# Patient Record
Sex: Female | Born: 1935 | Race: White | Hispanic: No | Smoking: Never smoker
Health system: Southern US, Community
[De-identification: ages and names within clinical notes are randomized; demographics above are authoritative.]

## PROBLEM LIST (undated history)

## (undated) DIAGNOSIS — I10 Essential (primary) hypertension: Secondary | ICD-10-CM

## (undated) DIAGNOSIS — I499 Cardiac arrhythmia, unspecified: Secondary | ICD-10-CM

## (undated) DIAGNOSIS — B9620 Unspecified Escherichia coli [E. coli] as the cause of diseases classified elsewhere: Secondary | ICD-10-CM

## (undated) DIAGNOSIS — I4891 Unspecified atrial fibrillation: Secondary | ICD-10-CM

## (undated) DIAGNOSIS — M7989 Other specified soft tissue disorders: Secondary | ICD-10-CM

## (undated) DIAGNOSIS — N390 Urinary tract infection, site not specified: Secondary | ICD-10-CM

## (undated) DIAGNOSIS — Q396 Congenital diverticulum of esophagus: Secondary | ICD-10-CM

## (undated) DIAGNOSIS — E079 Disorder of thyroid, unspecified: Secondary | ICD-10-CM

## (undated) HISTORY — PX: BREAST SURGERY: SHX581

## (undated) HISTORY — DX: Other specified soft tissue disorders: M79.89

## (undated) HISTORY — DX: Disorder of thyroid, unspecified: E07.9

## (undated) HISTORY — DX: Essential (primary) hypertension: I10

---

## 2000-09-15 ENCOUNTER — Other Ambulatory Visit: Admission: RE | Admit: 2000-09-15 | Discharge: 2000-09-15 | Payer: Self-pay | Admitting: *Deleted

## 2000-10-05 ENCOUNTER — Encounter: Admission: RE | Admit: 2000-10-05 | Discharge: 2000-10-05 | Payer: Self-pay | Admitting: *Deleted

## 2000-10-07 ENCOUNTER — Ambulatory Visit (HOSPITAL_BASED_OUTPATIENT_CLINIC_OR_DEPARTMENT_OTHER): Admission: RE | Admit: 2000-10-07 | Discharge: 2000-10-07 | Payer: Self-pay | Admitting: *Deleted

## 2000-10-15 ENCOUNTER — Ambulatory Visit: Admission: RE | Admit: 2000-10-15 | Discharge: 2001-01-13 | Payer: Self-pay | Admitting: Radiation Oncology

## 2010-08-27 ENCOUNTER — Encounter: Payer: Self-pay | Admitting: Podiatrist

## 2010-08-27 DIAGNOSIS — M109 Gout, unspecified: Secondary | ICD-10-CM | POA: Insufficient documentation

## 2010-08-27 DIAGNOSIS — I10 Essential (primary) hypertension: Secondary | ICD-10-CM | POA: Insufficient documentation

## 2010-08-27 DIAGNOSIS — R609 Edema, unspecified: Secondary | ICD-10-CM | POA: Insufficient documentation

## 2010-08-27 DIAGNOSIS — M779 Enthesopathy, unspecified: Secondary | ICD-10-CM | POA: Insufficient documentation

## 2010-08-27 DIAGNOSIS — E079 Disorder of thyroid, unspecified: Secondary | ICD-10-CM | POA: Insufficient documentation

## 2010-08-27 DIAGNOSIS — M7989 Other specified soft tissue disorders: Secondary | ICD-10-CM

## 2010-11-26 ENCOUNTER — Inpatient Hospital Stay (HOSPITAL_COMMUNITY)
Admission: EM | Admit: 2010-11-26 | Discharge: 2010-12-05 | DRG: 391 | Disposition: A | Discharge status: Home / Self Care | Payer: Medicare Other | Attending: Internal Medicine | Admitting: Internal Medicine

## 2010-11-26 DIAGNOSIS — Z9221 Personal history of antineoplastic chemotherapy: Secondary | ICD-10-CM

## 2010-11-26 DIAGNOSIS — A419 Sepsis, unspecified organism: Secondary | ICD-10-CM | POA: Diagnosis not present

## 2010-11-26 DIAGNOSIS — K8020 Calculus of gallbladder without cholecystitis without obstruction: Secondary | ICD-10-CM | POA: Diagnosis present

## 2010-11-26 DIAGNOSIS — I10 Essential (primary) hypertension: Secondary | ICD-10-CM | POA: Diagnosis present

## 2010-11-26 DIAGNOSIS — Z7982 Long term (current) use of aspirin: Secondary | ICD-10-CM

## 2010-11-26 DIAGNOSIS — Z853 Personal history of malignant neoplasm of breast: Secondary | ICD-10-CM

## 2010-11-26 DIAGNOSIS — N390 Urinary tract infection, site not specified: Secondary | ICD-10-CM | POA: Diagnosis present

## 2010-11-26 DIAGNOSIS — E039 Hypothyroidism, unspecified: Secondary | ICD-10-CM | POA: Diagnosis present

## 2010-11-26 DIAGNOSIS — R7881 Bacteremia: Secondary | ICD-10-CM | POA: Diagnosis present

## 2010-11-26 DIAGNOSIS — R079 Chest pain, unspecified: Secondary | ICD-10-CM | POA: Diagnosis present

## 2010-11-26 DIAGNOSIS — Z79899 Other long term (current) drug therapy: Secondary | ICD-10-CM

## 2010-11-26 DIAGNOSIS — K449 Diaphragmatic hernia without obstruction or gangrene: Principal | ICD-10-CM | POA: Diagnosis present

## 2010-11-26 DIAGNOSIS — M109 Gout, unspecified: Secondary | ICD-10-CM | POA: Diagnosis present

## 2010-11-26 DIAGNOSIS — A4151 Sepsis due to Escherichia coli [E. coli]: Secondary | ICD-10-CM | POA: Diagnosis not present

## 2010-11-26 DIAGNOSIS — I4891 Unspecified atrial fibrillation: Secondary | ICD-10-CM | POA: Diagnosis not present

## 2010-11-26 DIAGNOSIS — E785 Hyperlipidemia, unspecified: Secondary | ICD-10-CM | POA: Diagnosis present

## 2010-11-26 DIAGNOSIS — E669 Obesity, unspecified: Secondary | ICD-10-CM | POA: Diagnosis present

## 2010-11-26 DIAGNOSIS — J309 Allergic rhinitis, unspecified: Secondary | ICD-10-CM | POA: Diagnosis present

## 2010-11-27 ENCOUNTER — Emergency Department (HOSPITAL_COMMUNITY): Payer: Medicare Other

## 2010-11-27 LAB — POCT I-STAT TROPONIN I
Troponin i, poc: 0.02 ng/mL (ref 0.00–0.08)
Troponin i, poc: 0.03 ng/mL (ref 0.00–0.08)

## 2010-11-27 LAB — URINALYSIS, ROUTINE W REFLEX MICROSCOPIC
Bilirubin Urine: NEGATIVE
Glucose, UA: NEGATIVE mg/dL
Ketones, ur: 15 mg/dL — AB
Leukocytes, UA: NEGATIVE
Nitrite: POSITIVE — AB
Protein, ur: NEGATIVE mg/dL
Specific Gravity, Urine: 1.045 — ABNORMAL HIGH (ref 1.005–1.030)
Urobilinogen, UA: 1 mg/dL (ref 0.0–1.0)
pH: 5.5 (ref 5.0–8.0)

## 2010-11-27 LAB — APTT: aPTT: 31 seconds (ref 24–37)

## 2010-11-27 LAB — DIFFERENTIAL
Basophils Absolute: 0 10*3/uL (ref 0.0–0.1)
Eosinophils Absolute: 0 10*3/uL (ref 0.0–0.7)
Lymphocytes Relative: 12 % (ref 12–46)
Monocytes Relative: 4 % (ref 3–12)
Neutrophils Relative %: 84 % — ABNORMAL HIGH (ref 43–77)

## 2010-11-27 LAB — COMPREHENSIVE METABOLIC PANEL
ALT: 9 U/L (ref 0–35)
AST: 18 U/L (ref 0–37)
Albumin: 3.7 g/dL (ref 3.5–5.2)
Calcium: 9.8 mg/dL (ref 8.4–10.5)
Chloride: 104 mEq/L (ref 96–112)
Creatinine, Ser: 0.9 mg/dL (ref 0.50–1.10)
Sodium: 139 mEq/L (ref 135–145)
Total Bilirubin: 0.6 mg/dL (ref 0.3–1.2)

## 2010-11-27 LAB — CK TOTAL AND CKMB (NOT AT ARMC)
CK, MB: 2.6 ng/mL (ref 0.3–4.0)
CK, MB: 2 ng/mL (ref 0.3–4.0)
Relative Index: INVALID (ref 0.0–2.5)
Relative Index: INVALID (ref 0.0–2.5)
Total CK: 70 U/L (ref 7–177)
Total CK: 69 U/L (ref 7–177)

## 2010-11-27 LAB — CBC
Platelets: 214 10*3/uL (ref 150–400)
RBC: 4.47 MIL/uL (ref 3.87–5.11)
WBC: 8.5 10*3/uL (ref 4.0–10.5)

## 2010-11-27 LAB — URINE MICROSCOPIC-ADD ON

## 2010-11-27 LAB — CARDIAC PANEL(CRET KIN+CKTOT+MB+TROPI)
CK, MB: 2.5 ng/mL (ref 0.3–4.0)
Relative Index: 2 (ref 0.0–2.5)
Troponin I: <0.3 ng/mL (ref <0.30)
Troponin I: <0.3 ng/mL (ref <0.30)

## 2010-11-27 LAB — PROTIME-INR
INR: 0.95 (ref 0.00–1.49)
Prothrombin Time: 12.9 seconds (ref 11.6–15.2)

## 2010-11-27 LAB — HEMOGLOBIN A1C: Mean Plasma Glucose: 111 mg/dL (ref <117)

## 2010-11-27 LAB — TSH: TSH: 0.381 u[IU]/mL (ref 0.350–4.500)

## 2010-11-27 MED ORDER — IOHEXOL 300 MG/ML  SOLN
80.0000 mL | Freq: Once | INTRAMUSCULAR | Status: AC | PRN
  Administered 2010-11-27: 80 mL via INTRAVENOUS

## 2010-11-28 HISTORY — PX: TRANSTHORACIC ECHOCARDIOGRAM: SHX275

## 2010-11-28 LAB — BASIC METABOLIC PANEL
BUN: 20 mg/dL (ref 6–23)
CO2: 24 mEq/L (ref 19–32)
Calcium: 9.2 mg/dL (ref 8.4–10.5)
Chloride: 100 mEq/L (ref 96–112)
Creatinine, Ser: 1.49 mg/dL — ABNORMAL HIGH (ref 0.50–1.10)

## 2010-11-28 LAB — LIPID PANEL
Cholesterol: 138 mg/dL (ref 0–200)
LDL Cholesterol: 64 mg/dL (ref 0–99)
Total CHOL/HDL Ratio: 2.4 RATIO
VLDL: 16 mg/dL (ref 0–40)

## 2010-11-28 LAB — MAGNESIUM: Magnesium: 2.1 mg/dL (ref 1.5–2.5)

## 2010-11-29 LAB — CBC
HCT: 36.3 % (ref 36.0–46.0)
Hemoglobin: 12.4 g/dL (ref 12.0–15.0)
MCV: 94.3 fL (ref 78.0–100.0)
RBC: 3.85 MIL/uL — ABNORMAL LOW (ref 3.87–5.11)
RDW: 13.4 % (ref 11.5–15.5)
WBC: 11.3 10*3/uL — ABNORMAL HIGH (ref 4.0–10.5)

## 2010-11-29 LAB — BASIC METABOLIC PANEL
BUN: 19 mg/dL (ref 6–23)
CO2: 20 mEq/L (ref 19–32)
GFR calc non Af Amer: 38 mL/min — ABNORMAL LOW (ref >90)
Glucose, Bld: 105 mg/dL — ABNORMAL HIGH (ref 70–99)
Potassium: 3.7 mEq/L (ref 3.5–5.1)

## 2010-11-30 LAB — COMPREHENSIVE METABOLIC PANEL
BUN: 21 mg/dL (ref 6–23)
CO2: 21 mEq/L (ref 19–32)
Calcium: 9 mg/dL (ref 8.4–10.5)
Creatinine, Ser: 1.27 mg/dL — ABNORMAL HIGH (ref 0.50–1.10)
GFR calc Af Amer: 43 mL/min — ABNORMAL LOW (ref >90)
GFR calc non Af Amer: 37 mL/min — ABNORMAL LOW (ref >90)
Glucose, Bld: 131 mg/dL — ABNORMAL HIGH (ref 70–99)
Total Protein: 6.4 g/dL (ref 6.0–8.3)

## 2010-11-30 LAB — CBC
HCT: 35.8 % — ABNORMAL LOW (ref 36.0–46.0)
Hemoglobin: 12.6 g/dL (ref 12.0–15.0)
MCH: 33 pg (ref 26.0–34.0)
MCHC: 35.2 g/dL (ref 30.0–36.0)
MCV: 93.7 fL (ref 78.0–100.0)

## 2010-11-30 LAB — URINE CULTURE

## 2010-11-30 LAB — CULTURE, BLOOD (ROUTINE X 2)

## 2010-11-30 MED ORDER — SODIUM CHLORIDE 0.9 % IJ SOLN
3.0000 mL | Freq: Two times a day (BID) | INTRAMUSCULAR | Status: DC
  Administered 2010-12-01 – 2010-12-04 (×8): 3 mL via INTRAVENOUS

## 2010-11-30 MED ORDER — ALPRAZOLAM 0.25 MG PO TABS: 0.2500 mg | ORAL_TABLET | Freq: Two times a day (BID) | ORAL | Status: DC | PRN

## 2010-11-30 MED ORDER — PIPERACILLIN-TAZOBACTAM 3.375 G IVPB
3.3750 g | Freq: Three times a day (TID) | INTRAVENOUS | Status: AC
  Administered 2010-12-01 – 2010-12-03 (×6): 3.375 g via INTRAVENOUS
  Filled 2010-11-30 (×7): qty 50

## 2010-11-30 MED ORDER — PRAMOXINE HCL 1 % RE OINT: TOPICAL_OINTMENT | Freq: Three times a day (TID) | RECTAL | Status: DC | PRN

## 2010-11-30 MED ORDER — ALUM & MAG HYDROXIDE-SIMETH 200-200-20 MG/5ML PO SUSP: 15.0000 mL | ORAL | Status: DC | PRN

## 2010-11-30 MED ORDER — DILTIAZEM HCL ER COATED BEADS 240 MG PO CP24
240.0000 mg | ORAL_CAPSULE | Freq: Every day | ORAL | Status: DC
  Administered 2010-12-01 – 2010-12-05 (×5): 240 mg via ORAL
  Filled 2010-11-30 (×5): qty 1

## 2010-11-30 MED ORDER — ONDANSETRON HCL 4 MG/2ML IJ SOLN: 4.0000 mg | Freq: Four times a day (QID) | INTRAMUSCULAR | Status: DC | PRN

## 2010-11-30 MED ORDER — LEVOTHYROXINE SODIUM 75 MCG PO TABS
75.0000 ug | ORAL_TABLET | Freq: Every day | ORAL | Status: DC
  Administered 2010-12-01 – 2010-12-05 (×5): 75 ug via ORAL
  Filled 2010-11-30 (×6): qty 1

## 2010-11-30 MED ORDER — NITROGLYCERIN 0.4 MG SL SUBL: 0.4000 mg | SUBLINGUAL_TABLET | SUBLINGUAL | Status: DC | PRN

## 2010-11-30 MED ORDER — MAGNESIUM HYDROXIDE 400 MG/5ML PO SUSP: 30.0000 mL | Freq: Every day | ORAL | Status: DC | PRN

## 2010-11-30 MED ORDER — ENOXAPARIN SODIUM 80 MG/0.8ML ~~LOC~~ SOLN
70.0000 mg | SUBCUTANEOUS | Status: DC
  Administered 2010-12-01: 80 mg via SUBCUTANEOUS
  Administered 2010-12-02: 70 mg via SUBCUTANEOUS
  Filled 2010-11-30 (×2): qty 0.8

## 2010-11-30 MED ORDER — ACETAMINOPHEN 325 MG PO TABS: 650.0000 mg | ORAL_TABLET | ORAL | Status: DC | PRN

## 2010-11-30 MED ORDER — HYDROCORTISONE 1 % EX CREA: TOPICAL_CREAM | Freq: Three times a day (TID) | CUTANEOUS | Status: DC | PRN

## 2010-11-30 MED ORDER — ASPIRIN EC 325 MG PO TBEC
325.0000 mg | DELAYED_RELEASE_TABLET | Freq: Every day | ORAL | Status: DC
  Administered 2010-12-01 – 2010-12-05 (×5): 325 mg via ORAL
  Filled 2010-11-30 (×5): qty 1

## 2010-11-30 MED ORDER — GUAIFENESIN-DM 100-10 MG/5ML PO SYRP: 15.0000 mL | ORAL_SOLUTION | ORAL | Status: DC | PRN

## 2010-11-30 MED ORDER — OLMESARTAN 10 MG HALF TABLET
10.0000 mg | ORAL_TABLET | Freq: Every day | ORAL | Status: DC
  Administered 2010-12-01: 20 mg via ORAL
  Administered 2010-12-02 – 2010-12-03 (×2): 10 mg via ORAL
  Administered 2010-12-04: 10:00:00 via ORAL
  Administered 2010-12-05: 10 mg via ORAL
  Filled 2010-11-30: qty 0.5
  Filled 2010-11-30 (×2): qty 1
  Filled 2010-11-30: qty 0.5
  Filled 2010-11-30: qty 1

## 2010-11-30 MED ORDER — PANTOPRAZOLE SODIUM 40 MG PO TBEC
40.0000 mg | DELAYED_RELEASE_TABLET | Freq: Every day | ORAL | Status: DC
  Administered 2010-12-01 – 2010-12-04 (×4): 40 mg via ORAL

## 2010-11-30 MED ORDER — FLORA-Q PO CAPS
1.0000 | ORAL_CAPSULE | Freq: Every day | ORAL | Status: DC
  Administered 2010-11-30 – 2010-12-05 (×6): 1 via ORAL
  Filled 2010-11-30 (×6): qty 1

## 2010-11-30 MED ORDER — ZOLPIDEM TARTRATE 5 MG PO TABS: 5.0000 mg | ORAL_TABLET | Freq: Every evening | ORAL | Status: DC | PRN

## 2010-11-30 MED ORDER — LOPERAMIDE HCL 2 MG PO CAPS: 2.0000 mg | ORAL_CAPSULE | ORAL | Status: DC | PRN

## 2010-12-01 DIAGNOSIS — R7881 Bacteremia: Secondary | ICD-10-CM | POA: Diagnosis present

## 2010-12-01 DIAGNOSIS — R079 Chest pain, unspecified: Secondary | ICD-10-CM | POA: Diagnosis present

## 2010-12-01 DIAGNOSIS — K8020 Calculus of gallbladder without cholecystitis without obstruction: Secondary | ICD-10-CM | POA: Diagnosis present

## 2010-12-01 LAB — CULTURE, BLOOD (ROUTINE X 2): Culture  Setup Time: 201210312117

## 2010-12-01 MED ORDER — NYSTATIN 100000 UNIT/ML MT SUSP
5.0000 mL | Freq: Four times a day (QID) | OROMUCOSAL | Status: DC
  Administered 2010-12-01 – 2010-12-05 (×16): 500000 [IU] via ORAL
  Filled 2010-12-01 (×20): qty 5

## 2010-12-01 MED ORDER — SALINE SPRAY 0.65 % NA SOLN
1.0000 | NASAL | Status: DC | PRN
  Administered 2010-12-01: 1 via NASAL
  Filled 2010-12-01: qty 44

## 2010-12-01 NOTE — Progress Notes (Signed)
  Subjective: Feeling okay. Nose is stuffy.  Objective: Vital signs in last 24 hours: Temp:  [97.9 F (36.6 C)-98.9 F (37.2 C)] 97.9 F (36.6 C) (11/04 0544) Pulse Rate:  [65-71] 65  (11/04 0544) Resp:  [18-20] 19  (11/04 0544) BP: (104-118)/(1-68) 118/1 mmHg (11/04 0544) SpO2:  [92 %-93 %] 93 % (11/03 2116) Weight:  [68.2 kg (150 lb 5.7 oz)] 150 lb 5.7 oz (68.2 kg) (11/04 0544) Weight change:  Last BM Date: 12/01/10  Intake/Output from previous day: 11/03 0701 - 11/04 0700 In: 590 [P.O.:340; IV Piggyback:200] Out: 200 [Urine:200] Intake/Output this shift:    General appearance: alert, cooperative and appears stated age Resp: clear to auscultation bilaterally Cardio: regular rate and rhythm, S1, S2 normal, no murmur, click, rub or gallop GI: soft, non-tender; bowel sounds normal; no masses,  no organomegaly Extremities: extremities normal, atraumatic, no cyanosis or edema Neurologic: Grossly normal    Lab Results:  Basename 11/30/10 0931 11/29/10 0537  WBC 14.1* 11.3*  HGB 12.6 12.4  HCT 35.8* 36.3  PLT 212 196   BMET  Basename 11/30/10 0931 11/29/10 0537  NA 133* 134*  K 4.1 3.7  CL 102 102  CO2 21 20  GLUCOSE 131* 105*  BUN 21 19  CREATININE 1.27* 1.24*  CALCIUM 9.0 9.3   CMET CMP     Component Value Date/Time   NA 133* 11/30/2010 0931   K 4.1 11/30/2010 0931   CL 102 11/30/2010 0931   CO2 21 11/30/2010 0931   GLUCOSE 131* 11/30/2010 0931   BUN 21 11/30/2010 0931   CREATININE 1.27* 11/30/2010 0931   CALCIUM 9.0 11/30/2010 0931   PROT 6.4 11/30/2010 0931   ALBUMIN 2.5* 11/30/2010 0931   AST 12 11/30/2010 0931   ALT 10 11/30/2010 0931   ALKPHOS 82 11/30/2010 0931   BILITOT 1.5* 11/30/2010 0931   GFRNONAA 37* 11/30/2010 0931   GFRAA 43* 11/30/2010 0931     Studies/Results: No results found.  Medications: I have reviewed the patient's current medications.  Assessment/Plan:   Principal Problem:  *Bacteremia  Continue her antibiotics.  Add nystatin mw  given some sore throat.  Watch wbc daily as it is up a touch today.  Add saline nasal spray for nasal stuffiness.   Active Problems:  Chest pain  None now  Gallstone not an issue   LOS: 5 days   Ezequiel Kayser, MD 12/01/2010, 9:47 AM

## 2010-12-02 DIAGNOSIS — I4891 Unspecified atrial fibrillation: Secondary | ICD-10-CM

## 2010-12-02 LAB — COMPREHENSIVE METABOLIC PANEL
ALT: 9 U/L (ref 0–35)
Alkaline Phosphatase: 68 U/L (ref 39–117)
BUN: 20 mg/dL (ref 6–23)
CO2: 20 mEq/L (ref 19–32)
Calcium: 9 mg/dL (ref 8.4–10.5)
Chloride: 103 mEq/L (ref 96–112)
GFR calc Af Amer: 52 mL/min — ABNORMAL LOW (ref >90)
GFR calc non Af Amer: 45 mL/min — ABNORMAL LOW (ref >90)
Glucose, Bld: 95 mg/dL (ref 70–99)
Potassium: 3.5 mEq/L (ref 3.5–5.1)
Total Protein: 6.2 g/dL (ref 6.0–8.3)

## 2010-12-02 LAB — CBC
MCV: 92.7 fL (ref 78.0–100.0)
Platelets: 252 10*3/uL (ref 150–400)
RDW: 13 % (ref 11.5–15.5)
WBC: 9 10*3/uL (ref 4.0–10.5)

## 2010-12-02 MED ORDER — ENOXAPARIN SODIUM 80 MG/0.8ML ~~LOC~~ SOLN
70.0000 mg | Freq: Two times a day (BID) | SUBCUTANEOUS | Status: DC
  Administered 2010-12-03: 70 mg via SUBCUTANEOUS
  Filled 2010-12-02 (×3): qty 0.8

## 2010-12-02 MED ORDER — CIPROFLOXACIN HCL 500 MG PO TABS
500.0000 mg | ORAL_TABLET | Freq: Two times a day (BID) | ORAL | Status: DC
  Administered 2010-12-03 – 2010-12-05 (×5): 500 mg via ORAL
  Filled 2010-12-02 (×7): qty 1

## 2010-12-02 NOTE — Progress Notes (Addendum)
Subjective: PO better; still some discomfort in R grewter than L abd, better; No N/V, No SOB, No Palpitations, No CP; throat better some pain    Objective: Vital signs in last 24 hours: Temp:  [97.9 F (36.6 C)-98.5 F (36.9 C)] 98.1 F (36.7 C) (11/05 0445) Pulse Rate:  [63-86] 63  (11/05 0445) Resp:  [18-20] 18  (11/05 0445) BP: (97-116)/(51-83) 116/83 mmHg (11/05 0445) SpO2:  [91 %-95 %] 91 % (11/05 0445) Weight:  [68.1 kg (150 lb 2.1 oz)] 150 lb 2.1 oz (68.1 kg) (11/05 0445) Weight change: -0.1 kg (-3.5 oz) Last BM Date: 12/01/10  Intake/Output from previous day: 11/04 0701 - 11/05 0700 In: 717.5 [P.O.:680; IV Piggyback:37.5] Out: 801 [Urine:800; Stool:1] Intake/Output this shift:   NAD No OP lesions Supple CTA Ireegg Ireeg HS Abd Soft ND BS present Sl itender R greater than Left No edema; Pulses intact   Lab Results:  Basename 12/02/10 0553 11/30/10 0931  WBC 9.0 14.1*  HGB 13.3 12.6  HCT 37.9 35.8*  PLT 252 212   BMET  Basename 12/02/10 0553 11/30/10 0931  NA 135 133*  K 3.5 4.1  CL 103 102  CO2 20 21  GLUCOSE 95 131*  BUN 20 21  CREATININE 1.08 1.27*  CALCIUM 9.0 9.0    Studies/Results: No results found.  Medications: Current facility-administered medications:acetaminophen (TYLENOL) tablet 650 mg, 650 mg, Oral, Q4H PRN, Kieth Hartis R Tamilyn Lupien, MD;  ALPRAZolam (XANAX) tablet 0.25 mg, 0.25 mg, Oral, BID PRN, Adryanna Friedt R Doye Montilla, MD;  alum & mag hydroxide-simeth (MAALOX/MYLANTA) 200-200-20 MG/5ML suspension 15-30 mL, 15-30 mL, Oral, Q2H PRN, Marizol Borror R Aubry Rankin, MD;  aspirin EC tablet 325 mg, 325 mg, Oral, Daily, Manford Sprong R Yigit Norkus, MD, 325 mg at 12/01/10 0935 diltiazem (CARDIZEM CD) 24 hr capsule 240 mg, 240 mg, Oral, Daily, Tiffani Kadow R Ric Rosenberg, MD, 240 mg at 12/01/10 0936;  enoxaparin (LOVENOX) injection 70 mg, 70 mg, Subcutaneous, Q24H, Zyaire Mccleod R Zeynep Fantroy, MD, 80 mg at 12/01/10 1359;  Flora-Q (FLORA-Q) Capsule 1 capsule, 1 capsule, Oral, Daily, Dannielle Karvonen  Peletier, PHARMD, 1 capsule at 12/01/10 0937 guaiFENesin-dextromethorphan (ROBITUSSIN DM) 100-10 MG/5ML syrup 15 mL, 15 mL, Oral, Q4H PRN, Jemeka Wagler R Hurschel Paynter, MD;  hydrocortisone 1 % cream, , Topical, TID PRN, Sanjiv Castorena R Wauneta Silveria, MD;  levothyroxine (SYNTHROID, LEVOTHROID) tablet 75 mcg, 75 mcg, Oral, QAC breakfast, Marayah Higdon R Pualani Borah, MD, 75 mcg at 12/02/10 0635;  loperamide (IMODIUM) capsule 2-4 mg, 2-4 mg, Oral, PRN, Javanna Patin R Joshiah Traynham, MD magnesium hydroxide (MILK OF MAGNESIA) suspension 30 mL, 30 mL, Oral, Daily PRN, Randal Goens R Chanley Mcenery, MD;  nitroGLYCERIN (NITROSTAT) SL tablet 0.4 mg, 0.4 mg, Sublingual, Q5 Min x 3 PRN, Manny Vitolo R Sargun Rummell, MD;  nystatin (MYCOSTATIN) 100000 UNIT/ML suspension 500,000 Units, 5 mL, Oral, QID, Ezequiel Kayser, MD, 500,000 Units at 12/01/10 2111;  olmesartan (BENICAR) tablet 10 mg, 10 mg, Oral, Daily, Merrit Friesen R Shina Wass, MD, 20 mg at 12/01/10 0937 ondansetron (ZOFRAN) injection 4 mg, 4 mg, Intravenous, Q6H PRN, Shandel Busic R Fotios Amos, MD;  pantoprazole (PROTONIX) EC tablet 40 mg, 40 mg, Oral, Q1200, Inell Mimbs R Ashauna Bertholf, MD, 40 mg at 12/01/10 1359;  piperacillin-tazobactam (ZOSYN) IVPB 3.375 g, 3.375 g, Intravenous, Q8H, Makenah Karas R Laren Orama, MD, 3.375 g at 12/02/10 0635;  pramoxine-mineral oil-zinc (TUCK'S) rectal ointment, , Rectal, TID PRN, Camary Sosa R Shametra Cumberland, MD sodium chloride (OCEAN) 0.65 % nasal spray 1 spray, 1 spray, Each Nare, PRN, Ezequiel Kayser, MD, 1 spray at 12/01/10 1409;  sodium chloride 0.9 % injection 3 mL, 3  mL, Intravenous, Q12H, Tonjia Parillo R Jennel Mara, MD, 3 mL at 12/01/10 2200;  zolpidem (AMBIEN) tablet 5 mg, 5 mg, Oral, QHS PRN, Estephani Popper R Raunel Dimartino, MD   Assessment/Plan: Principal Problem:  *Bacteremia Afeb HD stable; WBC better/lower, Will change to PO ABx in the AM Active Problems:  Chest pain Asx CE neg ECHO good without low EF or wall motion abnl.  Gallstone Asx PO improving A Fib: Rate controlled; hopefully cards to see today; question when to start  On Anticoagulationj  for Afib or this related to current illness? Unable to see Cards note from Friday.   LOS: 6 days   Sheldon Sem R 12/02/2010, 7:27 AM  Cards note from 11/29/10 seen; they are recommending ASA 325mg  qday for 30 days with outpt Myoview/ Monitor unless recurrent Afib in which case we will need to anticoagulate. They have signed off. Please note that patient by physical exam is in atrial for ablation, may need to consider anticoagulation for this patient especially this continues, however it is unclear whether this is just related to her current bacteremia with both Escherichia coli and Klebsiella pneumoniae. Antibiotics have been changed from IV Zosyn starting tomorrow to oral Cipro. We will monitor her white blood cell count, hemodynamics, temp curve.

## 2010-12-02 NOTE — Plan of Care (Signed)
Problem: Phase II Progression Outcomes Goal: Progress activity as tolerated unless otherwise ordered Pt OOB with PT.  Progressing well.  Pt refused to ambulate in hallway on eval.  Will continue to assess.  Pt one person assist at this time.

## 2010-12-02 NOTE — Plan of Care (Signed)
Problem: Phase I Progression Outcomes Goal: Anticoagulation Therapy per MD order Outcome: Completed/Met Date Met:  12/02/10 Lovenox

## 2010-12-02 NOTE — Progress Notes (Signed)
ANTICOAGULATION CONSULT NOTE - Follow Up Consult  Pharmacy Consult for Lovenox Indication: atrial fibrillation  Allergies  Allergen Reactions  . Seasonal     Patient Measurements: Height: 5' (152.4 cm) (Entered for [employer]) Weight: 150 lb 2.1 oz (68.1 kg) IBW/kg (Calculated) : 45.5  Adjusted Body Weight:   Vital Signs: Temp: 98.2 F (36.8 C) (11/05 1344) Temp src: Oral (11/05 1344) BP: 110/72 mmHg (11/05 1344) Pulse Rate: 78  (11/05 1344)  Labs:  Basename 12/02/10 0553 11/30/10 0931  HGB 13.3 12.6  HCT 37.9 35.8*  PLT 252 212  APTT -- --  LABPROT -- --  INR -- --  HEPARINUNFRC -- --  CREATININE 1.08 1.27*  CKTOTAL -- --  CKMB -- --  TROPONINI -- --   Estimated Creatinine Clearance: 31.6 ml/min (by C-G formula based on Cr of 1.08).   Medications:  Scheduled:    . aspirin EC  325 mg Oral Daily  . ciprofloxacin  500 mg Oral BID  . diltiazem  240 mg Oral Daily  . enoxaparin (LOVENOX) injection  70 mg Subcutaneous Q24H  . Flora-Q  1 capsule Oral Daily  . levothyroxine  75 mcg Oral QAC breakfast  . nystatin  5 mL Oral QID  . olmesartan  10 mg Oral Daily  . pantoprazole  40 mg Oral Q1200  . piperacillin-tazobactam (ZOSYN)  IV  3.375 g Intravenous Q8H  . sodium chloride  3 mL Intravenous Q12H    Assessment: 89yof on full dose Lovenox for Afib. Confirmed with Dr. Felipa Eth continuation of Lovenox. SCr has improved (1.27-->1.08); CrCl now ~32 ml/min; H/H and plts wnl; no bleeding reported.   Goal of Therapy:  Anti-Xa level: 0.6-1.2   Plan:  1. Change Lovenox to 70mg  (1mg /kg) SQ q12h 2. Follow-up anticoagulation plan  Cleon Dew [phone] 12/02/2010,2:33 PM

## 2010-12-02 NOTE — Progress Notes (Signed)
Physical Therapy Evaluation Patient Details Name: Carrie Morris MRN: [id number] DOB: 1921/10/04 Today's Date: 12/02/2010  Problem List:  Patient Active Problem List  Diagnoses  . Gout  . Bilateral swelling of feet  . High blood pressure  . Thyroid disease  . Edema  . Capsulitis  . Chest pain  . Gallstone  . Bacteremia  . Atrial fibrillation    Past Medical History:  Past Medical History  Diagnosis Date  . Gout   . Bilateral swelling of feet   . High blood pressure   . Thyroid disease    Past Surgical History: No past surgical history on file.  PT Assessment/Plan/Recommendation PT Assessment Clinical Impression Statement: Pt will benefit from physical therapy in the acute setting to maximize independence and increase functional mobility to prepare for safe d/c home.   PT Recommendation/Assessment: Patient will need skilled PT in the acute care venue PT Problem List: Decreased activity tolerance;Decreased mobility Barriers to Discharge: None PT Therapy Diagnosis : Abnormality of gait;Generalized weakness PT Plan PT Frequency: Min 3X/week PT Treatment/Interventions: Gait training;Stair training;Functional mobility training;Balance training;Patient/family education PT Recommendation Follow Up Recommendations: Other (comment) (Will continue to further assess to make best recommendation.) Equipment Recommended: None recommended by PT PT Goals  Acute Rehab PT Goals PT Goal Formulation: With patient Time For Goal Achievement: 7 days Pt will go Supine/Side to Sit: with modified independence PT Goal: Supine/Side to Sit - Progress: Not met Pt will Ambulate: >150 feet;Independently PT Goal: Ambulate - Progress: Not met Additional Goals Additional Goal #1: Therapist will complete the DGI on the patient at the next treatment session to assess fall risk.  PT Evaluation Precautions/Restrictions  Precautions Precautions: Fall Required Braces or Orthoses:  No Restrictions Weight Bearing Restrictions: No Prior Functioning  Home Living Lives With: Carrie Morris Help From: Family Type of Home: Other (Comment) (Condo) Home Layout: One level Home Access: Level entry Entrance Stairs-Number of Steps: 0 Bathroom Shower/Tub: Health visitor: Standard Bathroom Accessibility: Yes Home Adaptive Equipment: Shower chair with back;Walker - rolling;Wheelchair - manual Prior Function Level of Independence: Independent with basic ADLs;Independent with homemaking with ambulation;Independent with transfers;Independent with gait Cognition Cognition Arousal/Alertness: Awake/alert Overall Cognitive Status: Appears within functional limits for tasks assessed Orientation Level: Oriented X4 Sensation/Coordination   Extremity Assessment RUE Assessment RUE Assessment: Within Functional Limits LUE Assessment LUE Assessment: Within Functional Limits RLE Assessment RLE Assessment: Within Functional Limits LLE Assessment LLE Assessment: Within Functional Limits Mobility (including Balance) Bed Mobility Bed Mobility: Yes Rolling Right: 6: Modified independent (Device/Increase time) Right Sidelying to Sit: 4: Min assist;HOB elevated (comment degrees) Right Sidelying to Sit Details (indicate cue type and reason): Verbal instructions to initiate and complete task.  Min (A) to initiate movement. Sitting - Scoot to Edge of Bed: 5: Supervision Sitting - Scoot to [place] of Bed Details (indicate cue type and reason): Verbal cues to initiate weight shifts to scoot Transfers Transfers: Yes Sit to Stand: 6: Modified independent (Device/Increase time) Stand to Sit: 6: Modified independent (Device/Increase time) Ambulation/Gait Ambulation/Gait: Yes Ambulation/Gait Assistance: Other (comment) (Min guard (A) for pt safety) Ambulation Distance (Feet): 40 Feet (25 ft c RW, 15 ft c No assistive device) Assistive device: Rolling walker;None (Gait first  assessed with RW, then with NO assistive device.) Gait Pattern: Shuffle Gait velocity: Unable to assess due to pt refusing to ambulate in hall until she has a robe. Stairs: No Wheelchair Mobility Wheelchair Mobility: No  Posture/Postural Control Posture/Postural Control: Postural limitations Postural Limitations: Slight forward flexed  posture with eyes looking down.  Verbal cues to look forward. Balance Balance Assessed: No (Pt refused to ambulate in hallway.  Perform DGI next session) Exercise    End of Session PT - End of Session Equipment Utilized During Treatment: Gait belt Activity Tolerance: Patient tolerated treatment well Patient left: in chair;with call bell in reach General Behavior During Session: [facility] for tasks performed Cognition: [facility] for tasks performed  Carrie Morris 12/02/2010, 12:08 PM

## 2010-12-03 LAB — BASIC METABOLIC PANEL
Chloride: 104 mEq/L (ref 96–112)
GFR calc Af Amer: 48 mL/min — ABNORMAL LOW (ref >90)
GFR calc non Af Amer: 41 mL/min — ABNORMAL LOW (ref >90)
Potassium: 3.8 mEq/L (ref 3.5–5.1)
Sodium: 137 mEq/L (ref 135–145)

## 2010-12-03 LAB — CBC
HCT: 37.1 % (ref 36.0–46.0)
Hemoglobin: 12.7 g/dL (ref 12.0–15.0)
MCHC: 34.2 g/dL (ref 30.0–36.0)
RDW: 13.1 % (ref 11.5–15.5)
WBC: 6 10*3/uL (ref 4.0–10.5)

## 2010-12-03 MED ORDER — ENOXAPARIN SODIUM 80 MG/0.8ML ~~LOC~~ SOLN
70.0000 mg | SUBCUTANEOUS | Status: DC
  Administered 2010-12-04: 70 mg via SUBCUTANEOUS
  Filled 2010-12-03 (×2): qty 0.8

## 2010-12-03 NOTE — Progress Notes (Signed)
Occupational Therapy Evaluation Patient Details Name: Carrie Morris MRN: [id number] DOB: [birth year] Today's Date: 12/03/2010  Problem List:  Patient Active Problem List  Diagnoses  . Gout  . Bilateral swelling of feet  . High blood pressure  . Thyroid disease  . Edema  . Capsulitis  . Chest pain  . Gallstone  . Bacteremia  . Atrial fibrillation    Past Medical History:  Past Medical History  Diagnosis Date  . Gout   . Bilateral swelling of feet   . High blood pressure   . Thyroid disease    Past Surgical History: No past surgical history on file.  OT Assessment/Plan/Recommendation OT Assessment Clinical Impression Statement: Pt will benefit from OT to increase I with ADL acitvity and return to I level with ADL activity. OT Recommendation/Assessment: Patient will need skilled OT in the acute care venue OT Problem List: Decreased strength;Decreased activity tolerance Barriers to Discharge: None OT Therapy Diagnosis : Generalized weakness OT Plan OT Frequency: Min 2X/week OT Treatment/Interventions: Self-care/ADL training;Patient/family education;Therapeutic activities OT Recommendation Follow Up Recommendations: Home health OT Equipment Recommended: None recommended by OT Individuals Consulted Consulted and Agree with Results and Recommendations: Patient OT Goals Acute Rehab OT Goals OT Goal Formulation: With patient Time For Goal Achievement: 7 days ADL Goals Pt Will Transfer to Toilet: with modified independence Pt Will Perform Toileting - Clothing Manipulation: with modified independence Pt Will Perform Toileting - Hygiene: with modified independence Pt Will Perform Tub/Shower Transfer: Tub transfer;Shower seat with back;with supervision  OT Evaluation Precautions/Restrictions  Precautions Precautions: Fall Required Braces or Orthoses: No Restrictions Weight Bearing Restrictions: No Prior Functioning   Prior Function Level of Independence: Independent  with basic ADLs ADL ADL Eating/Feeding: Not assessed Grooming: Wash/dry hands;Performed;Supervision/safety Where Assessed - Grooming: Standing at sink Upper Body Bathing: Simulated;Set up Where Assessed - Upper Body Bathing: Sitting, bed;Unsupported Lower Body Bathing: Simulated;Minimal assistance Where Assessed - Lower Body Bathing: Sitting, bed;Sit to stand from bed Upper Body Dressing: Simulated;Supervision/safety Where Assessed - Upper Body Dressing: Sitting, bed;Unsupported Lower Body Dressing: Minimal assistance;Performed Where Assessed - Lower Body Dressing: Sit to stand from bed Toilet Transfer: Performed;Supervision/safety Toilet Transfer Method: Proofreader: Regular height toilet;Grab bars Toileting - Clothing Manipulation: Performed Where Assessed - Glass blower/designer Manipulation: Standing Toileting - Hygiene: Performed;Supervision/safety Where Assessed - Toileting Hygiene: Standing Tub/Shower Transfer: Not assessed Vision/Perception  Vision - History Baseline Vision: Wears glasses only for reading Cognition Cognition Arousal/Alertness: Awake/alert Orientation Level: Oriented X4 Sensation/Coordination   Extremity Assessment RUE Assessment RUE Assessment: Within Functional Limits LUE Assessment LUE Assessment: Within Functional Limits Mobility  Transfers Transfers: Yes Sit to Stand: 5: Supervision Stand to Sit: 5: Supervision Exercises   End of Session OT - End of Session Activity Tolerance: Patient tolerated treatment well Patient left: in bed General Behavior During Session: [facility] for tasks performed   Kirt Boys 12/03/2010, 11:05 AM

## 2010-12-03 NOTE — Plan of Care (Signed)
Problem: Phase I Progression Outcomes Goal: Ventricular heart rate < 120/min Outcome: Completed/Met Date Met:  12/03/10 HR=86

## 2010-12-03 NOTE — Progress Notes (Signed)
Subjective: Patient states that she feels much better this morning, good by mouth intake, no nausea or vomiting. No nominal pain, bowel movement this morning. The shortness of breath, chest pain, no fevers or chills, no palpitations.  Objective: Vital signs in last 24 hours: Temp:  [97.8 F (36.6 C)-98.3 F (36.8 C)] 97.8 F (36.6 C) (11/06 0433) Pulse Rate:  [74-80] 80  (11/06 0433) Resp:  [18-19] 18  (11/06 0433) BP: (110-144)/(54-79) 144/79 mmHg (11/06 0433) SpO2:  [95 %-96 %] 95 % (11/06 0433) Weight:  [67.8 kg (149 lb 7.6 oz)] 149 lb 7.6 oz (67.8 kg) (11/06 0433) Weight change: -0.3 kg (-10.6 oz) Last BM Date: 12/01/10  Intake/Output from previous day: 11/05 0701 - 11/06 0700 In: 230 [P.O.:180; IV Piggyback:50] Out: 200 [Urine:200] Intake/Output this shift:    General appearance: alert, cooperative, appears stated age and no distress Head: Normocephalic, without obvious abnormality, atraumatic Neck: no adenopathy, no JVD, supple, symmetrical, trachea midline and thyroid not enlarged, symmetric, no tenderness/mass/nodules Resp: clear to auscultation bilaterally Cardio: regular rate and rhythm, S1, S2 normal, no murmur, click, rub or gallop GI: soft, non-tender; bowel sounds normal; no masses,  no organomegaly Extremities: edema Tr edema Pulses: 2+ and symmetric  Lab Results:  Basename 12/03/10 0617 12/02/10 0553  WBC 6.0 9.0  HGB 12.7 13.3  HCT 37.1 37.9  PLT 296 252   BMET  Basename 12/03/10 0617 12/02/10 0553  NA 137 135  K 3.8 3.5  CL 104 103  CO2 24 20  GLUCOSE 89 95  BUN 19 20  CREATININE 1.16* 1.08  CALCIUM 9.0 9.0    Studies/Results: No results found.  Medications: I have reviewed the patient's current medications.  Assessment/Plan: Patient Active Hospital Problem List: Bacteremia (12/01/2010)   Assessment: Patient afebrile, white blood cell count coming down, hemodynamically stable, will change IV Zosyn to oral Cipro based on sensitivity  results and monitor    Plan: If patient remains afebrile, hemodynamically stable with normal white blood cell count, will plan on discharge within the next 48 hours.  Chest pain (12/01/2010)   Assessment: Asymptomatic, no chest pain, no nausea or vomiting, no epigastric pain.    Plan: Continue to monitor  Gallstone (12/01/2010)   Assessment: Asymptomatic    Plan: Moderate  Atrial fibrillation (12/02/2010)   Assessment: Currently in sinus rhythm, based on telemetry did have one run of SVT within the last 24 hours, we'll continue to monitor, for the most part has been staying in sinus rhythm. If atrial fibrillation becomes recurrent see below the    Plan: Currently asymptomatic and in sinus rhythm, if recurrent atrial fibrillation we'll anticoagulate as previously planned and will continue to have followup with cardiology.   LOS: 7 days   Abdo Denault R 12/03/2010, 8:06 AM

## 2010-12-03 NOTE — Progress Notes (Signed)
Physical Therapy Treatment Patient Details Name: KIONNA BRIER MRN: [id number] DOB: Mar 24, 1921 Today's Date: 12/03/2010  PT Assessment/Plan  PT - Assessment/Plan Comments on Treatment Session: Pt tolerated an increase in ambulation distance and part of the DGI was performed.  Patient refused to try stairs today, and therapy session was ended early due to patient's lunch arriving.  Pt is slightly impulsive with sit to stand and ambulation.  Pt very opinionated.  PT Plan: Discharge plan remains appropriate;Frequency remains appropriate Equipment Recommended: None recommended by OT PT Goals  Acute Rehab PT Goals PT Goal: Supine/Side to Sit - Progress: Met PT Goal: Ambulate - Progress: Progressing toward goal Additional Goals PT Goal: Additional Goal #1 - Progress: Partly met  PT Treatment Precautions/Restrictions  Precautions Precautions: Fall Required Braces or Orthoses: No Restrictions Weight Bearing Restrictions: No Mobility (including Balance) Bed Mobility Bed Mobility: Yes Rolling Left: 6: Modified independent (Device/Increase time) Left Sidelying to Sit: 6: Modified independent (Device/Increase time) Sitting - Scoot to Edge of Bed: 7: Independent Transfers Transfers: Yes Sit to Stand: 7: Independent Stand to Sit: 7: Independent Ambulation/Gait Ambulation/Gait: Yes Ambulation/Gait Assistance: Other (comment) (minguard (A)) Ambulation Distance (Feet): 200 Feet Assistive device: Rolling walker Gait Pattern: Step-through pattern Gait velocity: 1.92 Stairs: No Wheelchair Mobility Wheelchair Mobility: No  Posture/Postural Control Posture/Postural Control: Postural limitations Postural Limitations: Slight forward flexed posture with eyes looking down.  Verbal cues to look forward. Balance Balance Assessed: No Dynamic Gait Index Level Surface: Mild Impairment Change in Gait Speed: Mild Impairment Gait with Horizontal Head Turns: Mild Impairment Gait with Vertical Head  Turns: Mild Impairment Gait and Pivot Turn: Mild Impairment Incomplete DGI due to pt refuse steps and unable to finish secondary to lunch arriving.  Pt wanted to use RW with DGI even though PT wanted to attempt without RW.  Pt was ambulating in room without AD with minguard. Exercise    End of Session PT - End of Session Equipment Utilized During Treatment: Gait belt;Other (comment) (RW) Activity Tolerance: Patient tolerated treatment well Patient left: in chair;with call bell in reach General Behavior During Session: [facility] for tasks performed Cognition: [facility] for tasks performed  Lacinda Axon, SPT Renezmae Canlas 12/03/2010, 1:44 PM

## 2010-12-04 LAB — BASIC METABOLIC PANEL
BUN: 14 mg/dL (ref 6–23)
CO2: 22 mEq/L (ref 19–32)
Chloride: 106 mEq/L (ref 96–112)
Glucose, Bld: 86 mg/dL (ref 70–99)
Potassium: 3.6 mEq/L (ref 3.5–5.1)
Sodium: 140 mEq/L (ref 135–145)

## 2010-12-04 LAB — CBC
HCT: 37.2 % (ref 36.0–46.0)
Hemoglobin: 12.6 g/dL (ref 12.0–15.0)
RBC: 3.98 MIL/uL (ref 3.87–5.11)

## 2010-12-04 MED ORDER — ENOXAPARIN SODIUM 40 MG/0.4ML ~~LOC~~ SOLN
40.0000 mg | SUBCUTANEOUS | Status: DC
  Filled 2010-12-04 (×2): qty 0.4

## 2010-12-04 NOTE — Progress Notes (Signed)
Subjective: Patient feels well, no nausea, vomiting, fevers, chills, caloric intake good, no abdominal pain, bowel movement this morning, no blood in stool or urine, no palpitations, chest pain or epigastric discomfort or shortness of breath. Working with physical therapy, refuses to the stairs given that stairs are not located in her house or with access to her house. Somewhat disconcerted secondary to staph being in her rhythm taking prolonged periods of time to use the computer.  Objective: Vital signs in last 24 hours: Temp:  [97.6 F (36.4 C)-98.3 F (36.8 C)] 97.6 F (36.4 C) (11/07 0531) Pulse Rate:  [61-70] 62  (11/07 0531) Resp:  [18] 18  (11/07 0531) BP: (125-146)/(58-84) 146/84 mmHg (11/07 0531) SpO2:  [93 %-96 %] 93 % (11/07 0531) Weight:  [67.9 kg (149 lb 11.1 oz)] 149 lb 11.1 oz (67.9 kg) (11/07 0255) Weight change: 0.1 kg (3.5 oz) Last BM Date: 12/03/10  Intake/Output from previous day: 11/06 0701 - 11/07 0700 In: 280 [P.O.:280] Out: 301 [Urine:300; Stool:1] Intake/Output this shift:   Blood Culture    Component Value Date/Time   SDES IN/OUT CATH URINE 11/27/2010 1511   SPECREQUEST NONE 11/27/2010 1511   CULT  Value: ESCHERICHIA COLI KLEBSIELLA PNEUMONIAE 11/27/2010 1511   REPTSTATUS 11/30/2010 FINAL 11/27/2010 1511    No apparent distress, appropriate, conversant Face symmetrical, neck supple, no oropharyngeal lesions No cervical lymphadenopathy Clear to auscultation bilaterally heart Regular rate and rhythm without murmurs Abdomen soft, nontender, nondistended, bowel sounds present No suprapubic tenderness No edema good pedal pulses intact, no evidence of cyanosis, Neurological exam grossly nonfocal   Lab Results:  Basename 12/04/10 0603 12/03/10 0617  WBC 5.8 6.0  HGB 12.6 12.7  HCT 37.2 37.1  PLT 343 296   BMET  Basename 12/04/10 0603 12/03/10 0617  NA 140 137  K 3.6 3.8  CL 106 104  CO2 22 24  GLUCOSE 86 89  BUN 14 19  CREATININE 1.00  1.16*  CALCIUM 9.3 9.0  .bllod  Studies/Results: No results found.  Medications: I have reviewed the patient's current medications.  Assessment/Plan: Patient Active Hospital Problem List: Bacteremia (12/01/2010) patient afebrile, white blood cell count now normal, hemodynamically stable, will continue current oral antibiotics, Cipro change within the last 24 hours.   Chest pain (12/01/2010) patient has ruled out for MI, asymptomatic now, no further epigastric discomfort. Suspect this is secondary to hiatal hernia, will continue proton pump inhibitor on an outpatient basis.   Gallstone (12/01/2010) caloric intake good, no nausea, vomiting or abdominal pain, will follow   Atrial fibrillation (12/02/2010) probably related to bacteremia, and currently asymptomatic in normal sinus rhythm for at least 36 hours, we'll continue to follow on an outpatient basis with possible monitor per cardiology consultation. Disposition: We'll plan for potential discharge if patient remains stable in 24 hours.     LOS: 8 days   Fabien Travelstead R 12/04/2010, 7:45 AM

## 2010-12-05 LAB — BASIC METABOLIC PANEL
BUN: 13 mg/dL (ref 6–23)
CO2: 23 mEq/L (ref 19–32)
Calcium: 8.9 mg/dL (ref 8.4–10.5)
Glucose, Bld: 86 mg/dL (ref 70–99)
Sodium: 138 mEq/L (ref 135–145)

## 2010-12-05 LAB — CBC
HCT: 35.6 % — ABNORMAL LOW (ref 36.0–46.0)
Hemoglobin: 12 g/dL (ref 12.0–15.0)
MCH: 31.6 pg (ref 26.0–34.0)
MCV: 93.7 fL (ref 78.0–100.0)
RBC: 3.8 MIL/uL — ABNORMAL LOW (ref 3.87–5.11)

## 2010-12-05 MED ORDER — CIPROFLOXACIN HCL 500 MG PO TABS: 500.0000 mg | ORAL_TABLET | Freq: Two times a day (BID) | ORAL | Status: AC

## 2010-12-05 MED ORDER — DILTIAZEM HCL ER COATED BEADS 240 MG PO CP24: 240.0000 mg | ORAL_CAPSULE | Freq: Every day | ORAL | Status: DC

## 2010-12-05 MED ORDER — ASPIRIN 325 MG PO TBEC: 325.0000 mg | DELAYED_RELEASE_TABLET | Freq: Every day | ORAL | Status: AC

## 2010-12-05 MED ORDER — PANTOPRAZOLE SODIUM 40 MG PO TBEC: 40.0000 mg | DELAYED_RELEASE_TABLET | Freq: Every day | ORAL | Status: DC

## 2010-12-05 NOTE — Discharge Summary (Signed)
Physician Discharge Summary  Patient ID: Carrie Morris MRN: [id number] DOB/AGE: 1921-02-22 77 y.o.  Admit date: 11/26/2010 Discharge date: 12/05/2010  Admission Diagnoses: Epigastric discomfort, significant hiatal hernia, rule out MI, urinary tract infection  Discharge Diagnoses:  Principal Problem:  *Bacteremia with blood cultures growing out Escherichia coli pansensitive, urine culture also growing out Klebsiella pneumonia is sensitive to Cipro Active Problems:  Chest pain vision ruled out for myocardial infarction  Gallstone asymptomatic with no abnormalities of liver function test  Atrial fibrillation associated with high fever, leukocytosis and bacteremia, resolved during hospitalization with monitoring on telemetry and initiation of calcium channel blockade, cardiology consult obtained, echocardiogram unremarkable.   Discharged Condition: good  Hospital Course: Patient presented to the emergency room with epigastric discomfort, concern for cardiac ischemia. EKG unremarkable, patient remained hemodynamically stable, initial set of cardiac enzymes negative, patient admitted by myself for further evaluation and management. Within the next 24 hours, patient developed high fevers in excess of 102F, urine cultures grew out both Escherichia coli and Klebsiella pneumoniae, with the Escherichia coli being pansensitive Klebsiella pneumoniae being resistant to ampicillin but sensitive to Cipro. Patient was maintained on Zosyn intravenously, with a reduction in the patient's leukocytosis during normal levels. Within the last 3 days of hospitalization, patient transferred over to oral Cipro, patient remained afebrile, atrial fibrillation which occurred within the first 24 hours and was associated with rapid ventricular response and controlled with calcium channel blockade resolved with transfer to sinus rhythm. Cardiology consult obtained, noted association with bacteremia, and current sinus rhythm,  noted normal echocardiogram with normal ejection fraction and normal function and recommended further outpatient therapy with anticoagulation if atrial fibrillation becomes recurrent. They also indicated that they may perform event monitor and/or stress testing given new onset atrial for ablation which has resolved but again this is associated with bacteremia and high fever.  Consults: Cardiology consult by [facility] in heart and vascular Center  Significant Diagnostic Studies: Results for orders placed during the hospital encounter of 11/26/10 (from the past 72 hour(s))  CBC     Status: Normal   Collection Time   12/03/10  6:17 AM      Component Value Range Comment   WBC 6.0  4.0 - 10.5 (K/uL)    RBC 3.96  3.87 - 5.11 (MIL/uL)    Hemoglobin 12.7  12.0 - 15.0 (g/dL)    HCT 30.1  60.1 - 09.3 (%)    MCV 93.7  78.0 - 100.0 (fL)    MCH 32.1  26.0 - 34.0 (pg)    MCHC 34.2  30.0 - 36.0 (g/dL)    RDW 23.5  57.3 - 22.0 (%)    Platelets 296  150 - 400 (K/uL)   BASIC METABOLIC PANEL     Status: Abnormal   Collection Time   12/03/10  6:17 AM      Component Value Range Comment   Sodium 137  135 - 145 (mEq/L)    Potassium 3.8  3.5 - 5.1 (mEq/L) HEMOLYSIS AT THIS LEVEL MAY AFFECT RESULT   Chloride 104  96 - 112 (mEq/L)    CO2 24  19 - 32 (mEq/L)    Glucose, Bld 89  70 - 99 (mg/dL)    BUN 19  6 - 23 (mg/dL)    Creatinine, Ser 2.54 (*) 0.50 - 1.10 (mg/dL)    Calcium 9.0  8.4 - 10.5 (mg/dL)    GFR calc non Af Amer 41 (*) >90 (mL/min)    GFR calc Af Denyse Dago  48 (*) >90 (mL/min)   CBC     Status: Normal   Collection Time   12/04/10  6:03 AM      Component Value Range Comment   WBC 5.8  4.0 - 10.5 (K/uL)    RBC 3.98  3.87 - 5.11 (MIL/uL)    Hemoglobin 12.6  12.0 - 15.0 (g/dL)    HCT 16.1  09.6 - 04.5 (%)    MCV 93.5  78.0 - 100.0 (fL)    MCH 31.7  26.0 - 34.0 (pg)    MCHC 33.9  30.0 - 36.0 (g/dL)    RDW 40.9  81.1 - 91.4 (%)    Platelets 343  150 - 400 (K/uL)   BASIC METABOLIC PANEL     Status:  Abnormal   Collection Time   12/04/10  6:03 AM      Component Value Range Comment   Sodium 140  135 - 145 (mEq/L)    Potassium 3.6  3.5 - 5.1 (mEq/L)    Chloride 106  96 - 112 (mEq/L)    CO2 22  19 - 32 (mEq/L)    Glucose, Bld 86  70 - 99 (mg/dL)    BUN 14  6 - 23 (mg/dL)    Creatinine, Ser 7.82  0.50 - 1.10 (mg/dL)    Calcium 9.3  8.4 - 10.5 (mg/dL)    GFR calc non Af Amer 49 (*) >90 (mL/min)    GFR calc Af Amer 57 (*) >90 (mL/min)   CBC     Status: Abnormal   Collection Time   12/05/10  6:52 AM      Component Value Range Comment   WBC 6.5  4.0 - 10.5 (K/uL)    RBC 3.80 (*) 3.87 - 5.11 (MIL/uL)    Hemoglobin 12.0  12.0 - 15.0 (g/dL)    HCT 95.6 (*) 21.3 - 46.0 (%)    MCV 93.7  78.0 - 100.0 (fL)    MCH 31.6  26.0 - 34.0 (pg)    MCHC 33.7  30.0 - 36.0 (g/dL)    RDW 08.6  57.8 - 46.9 (%)    Platelets 365  150 - 400 (K/uL)   BASIC METABOLIC PANEL     Status: Abnormal   Collection Time   12/05/10  6:52 AM      Component Value Range Comment   Sodium 138  135 - 145 (mEq/L)    Potassium 3.5  3.5 - 5.1 (mEq/L)    Chloride 105  96 - 112 (mEq/L)    CO2 23  19 - 32 (mEq/L)    Glucose, Bld 86  70 - 99 (mg/dL)    BUN 13  6 - 23 (mg/dL)    Creatinine, Ser 6.29  0.50 - 1.10 (mg/dL)    Calcium 8.9  8.4 - 10.5 (mg/dL)    GFR calc non Af Amer 47 (*) >90 (mL/min)    GFR calc Af Amer 54 (*) >90 (mL/min)    CT scan on the abdomen and pelvis on admission in the emergency room  IMPRESSION:  Large hiatal hernia.  Descending colonic and sigmoid diverticulosis.  Cholelithiasis.  Small left inguinal hernia.  No acute findings.  Original Report Authenticated By: Cyndie Chime, M.D.  Echocardiogram 11/28/2010 revealed normal ejection fraction, please review the scan for details, but no significant abnormalities and no evidence of significant wall motion abnormalities.   Treatments: antibiotics: Zosyn was transferred to Cipro and anticoagulation: With atrial correlation using subcutaneous  Lovenox 1 mg  per kilogram twice a day changing to DVT prophylaxis during the last 24 hours.  Discharge Exam: Blood pressure 132/73, pulse 63, temperature 98.4 F (36.9 C), temperature source Oral, resp. rate 18, height 5' (1.524 m), weight 67.359 kg (148 lb 8 oz), SpO2 96.00%. Patient no apparent distress, sitting on the edge of the bed, has ambulated down the hall and back with minimal assistance, and no orthostasis, asymmetric, neck supple, no cervical lymphadenopathy, clip lungs clear to auscultation bilaterally heart, cardiovascular irregular rate and rhythm, abdominal exam is soft nontender nondistended active bowel sounds are present, no edema, neurological exam grossly nonfocal. Discharge to home discharge to home discharge to home Disposition:   Current Discharge Medication List    START taking these medications   Details  aspirin EC 325 MG EC tablet Take 1 tablet (325 mg total) by mouth daily. Qty: 30 tablet, Refills: 6    ciprofloxacin (CIPRO) 500 MG tablet Take 1 tablet (500 mg total) by mouth 2 (two) times daily. Qty: 14 tablet, Refills: 0    diltiazem (CARDIZEM CD) 240 MG 24 hr capsule Take 1 capsule (240 mg total) by mouth daily. Qty: 30 capsule, Refills: 3    pantoprazole (PROTONIX) 40 MG tablet Take 1 tablet (40 mg total) by mouth daily at 12 noon. Qty: 30 tablet, Refills: 6      CONTINUE these medications which have NOT CHANGED   Details  levothyroxine (SYNTHROID, LEVOTHROID) 75 MCG tablet Take 75 mcg by mouth daily.      valsartan (DIOVAN) 160 MG tablet Take 160 mg by mouth daily.         Follow-up Information    Follow up with Hoyle Sauer, MD. Make an appointment in 1 week. (already scheduled)    Contact information:   [address] [facility], [place]. [place] [address] [phone]          Signed: Hoyle Sauer 12/05/2010, 10:36 AM

## 2011-01-31 DIAGNOSIS — Z7901 Long term (current) use of anticoagulants: Secondary | ICD-10-CM | POA: Diagnosis not present

## 2011-01-31 DIAGNOSIS — I4891 Unspecified atrial fibrillation: Secondary | ICD-10-CM | POA: Diagnosis not present

## 2011-02-11 DIAGNOSIS — Z7901 Long term (current) use of anticoagulants: Secondary | ICD-10-CM | POA: Diagnosis not present

## 2011-02-11 DIAGNOSIS — I4891 Unspecified atrial fibrillation: Secondary | ICD-10-CM | POA: Diagnosis not present

## 2011-03-07 DIAGNOSIS — H04129 Dry eye syndrome of unspecified lacrimal gland: Secondary | ICD-10-CM | POA: Diagnosis not present

## 2011-03-07 DIAGNOSIS — Z961 Presence of intraocular lens: Secondary | ICD-10-CM | POA: Diagnosis not present

## 2011-03-07 DIAGNOSIS — H26499 Other secondary cataract, unspecified eye: Secondary | ICD-10-CM | POA: Diagnosis not present

## 2011-03-07 DIAGNOSIS — H1045 Other chronic allergic conjunctivitis: Secondary | ICD-10-CM | POA: Diagnosis not present

## 2011-03-13 DIAGNOSIS — Z7901 Long term (current) use of anticoagulants: Secondary | ICD-10-CM | POA: Diagnosis not present

## 2011-03-13 DIAGNOSIS — I4891 Unspecified atrial fibrillation: Secondary | ICD-10-CM | POA: Diagnosis not present

## 2011-04-02 DIAGNOSIS — I10 Essential (primary) hypertension: Secondary | ICD-10-CM | POA: Diagnosis not present

## 2011-04-02 DIAGNOSIS — I4891 Unspecified atrial fibrillation: Secondary | ICD-10-CM | POA: Diagnosis not present

## 2011-04-11 DIAGNOSIS — Z7901 Long term (current) use of anticoagulants: Secondary | ICD-10-CM | POA: Diagnosis not present

## 2011-04-11 DIAGNOSIS — I10 Essential (primary) hypertension: Secondary | ICD-10-CM | POA: Diagnosis not present

## 2011-04-11 DIAGNOSIS — E039 Hypothyroidism, unspecified: Secondary | ICD-10-CM | POA: Diagnosis not present

## 2011-04-11 DIAGNOSIS — I4891 Unspecified atrial fibrillation: Secondary | ICD-10-CM | POA: Diagnosis not present

## 2011-05-13 DIAGNOSIS — Z7901 Long term (current) use of anticoagulants: Secondary | ICD-10-CM | POA: Diagnosis not present

## 2011-05-13 DIAGNOSIS — I4891 Unspecified atrial fibrillation: Secondary | ICD-10-CM | POA: Diagnosis not present

## 2011-06-12 DIAGNOSIS — I4891 Unspecified atrial fibrillation: Secondary | ICD-10-CM | POA: Diagnosis not present

## 2011-06-12 DIAGNOSIS — Z7901 Long term (current) use of anticoagulants: Secondary | ICD-10-CM | POA: Diagnosis not present

## 2011-07-05 ENCOUNTER — Encounter (HOSPITAL_COMMUNITY): Payer: Self-pay

## 2011-07-05 ENCOUNTER — Emergency Department (HOSPITAL_COMMUNITY): Payer: Medicare Other

## 2011-07-05 ENCOUNTER — Emergency Department (HOSPITAL_COMMUNITY)
Admission: EM | Admit: 2011-07-05 | Discharge: 2011-07-05 | Disposition: A | Discharge status: Home / Self Care | Payer: Medicare Other | Attending: Emergency Medicine | Admitting: Emergency Medicine

## 2011-07-05 DIAGNOSIS — E079 Disorder of thyroid, unspecified: Secondary | ICD-10-CM | POA: Diagnosis not present

## 2011-07-05 DIAGNOSIS — G319 Degenerative disease of nervous system, unspecified: Secondary | ICD-10-CM | POA: Insufficient documentation

## 2011-07-05 DIAGNOSIS — R296 Repeated falls: Secondary | ICD-10-CM | POA: Diagnosis not present

## 2011-07-05 DIAGNOSIS — S0191XA Laceration without foreign body of unspecified part of head, initial encounter: Secondary | ICD-10-CM

## 2011-07-05 DIAGNOSIS — W19XXXA Unspecified fall, initial encounter: Secondary | ICD-10-CM

## 2011-07-05 DIAGNOSIS — R791 Abnormal coagulation profile: Secondary | ICD-10-CM | POA: Diagnosis not present

## 2011-07-05 DIAGNOSIS — S0100XA Unspecified open wound of scalp, initial encounter: Secondary | ICD-10-CM | POA: Diagnosis not present

## 2011-07-05 DIAGNOSIS — S0190XA Unspecified open wound of unspecified part of head, initial encounter: Secondary | ICD-10-CM | POA: Diagnosis not present

## 2011-07-05 DIAGNOSIS — Z7901 Long term (current) use of anticoagulants: Secondary | ICD-10-CM | POA: Diagnosis not present

## 2011-07-05 DIAGNOSIS — Z283 Underimmunization status: Secondary | ICD-10-CM | POA: Diagnosis not present

## 2011-07-05 DIAGNOSIS — Y92009 Unspecified place in unspecified non-institutional (private) residence as the place of occurrence of the external cause: Secondary | ICD-10-CM | POA: Insufficient documentation

## 2011-07-05 DIAGNOSIS — Z9181 History of falling: Secondary | ICD-10-CM | POA: Diagnosis not present

## 2011-07-05 DIAGNOSIS — W1809XA Striking against other object with subsequent fall, initial encounter: Secondary | ICD-10-CM | POA: Insufficient documentation

## 2011-07-05 LAB — PROTIME-INR: INR: 1.5 — ABNORMAL HIGH (ref 0.00–1.49)

## 2011-07-05 LAB — CBC
HCT: 37.4 % (ref 36.0–46.0)
Hemoglobin: 12.7 g/dL (ref 12.0–15.0)
MCHC: 34 g/dL (ref 30.0–36.0)
MCV: 94.4 fL (ref 78.0–100.0)

## 2011-07-05 MED ORDER — BACITRACIN 500 UNIT/GM EX OINT
1.0000 "application " | TOPICAL_OINTMENT | Freq: Two times a day (BID) | CUTANEOUS | Status: DC
  Administered 2011-07-05: 1 via TOPICAL
  Filled 2011-07-05: qty 0.9

## 2011-07-05 NOTE — Discharge Instructions (Signed)
Your coumadin level was slightly low today- please call the doctor who writes this prescription for you to discuss any necessary changes.   The staples should come out in 5-7 days. You can have these removed at your doctor, an urgent care, or the ER.  Return to the ER if you develop concerning signs of a severe head injury to include change in vision, trouble speaking, confusion. Your CT scans of the head and neck today did not show any bleeding in the brain or any broken bones.     Staple Wound Closure Your wound has been cleaned and closed with skin staples. HOME CARE  Keep the area around the staples clean and dry.   Rest and raise (elevate) the injured part above the level of your heart.   See your doctor for a follow-up check of the wound.   See your doctor to have the staples removed.   As the wound heals, you may leave it open to the air and clean it daily with water.   Do not soak the wound in water for long periods of time.   Watch for signs of a wound infection:   Unusual redness or puffiness around the wound.   Increasing pain or tenderness.   Yellowish white fluid (pus) coming from the wound.  You may need a tetanus shot if:  You cannot remember when you had your last tetanus shot.   You have never had a tetanus shot.   The injury broke your skin.  If you need a tetanus shot and you choose not to have one, you may get tetanus. Sickness from tetanus can be serious. GET HELP RIGHT AWAY IF:   You think the wound is infected.   The wound does not stay together after the staples have been taken out.   Something comes out of the wound, such as wood or glass.   You or your child has problems moving the injured area.   You or your child has a temperature by mouth above 102 F (38.9 C), not controlled by medicine.  MAKE SURE YOU:   Understand these instructions.   Will watch this condition.   Will get help right away if you or your child is not doing well  or gets worse.  Document Released: 10/23/2007 Document Revised: 01/02/2011 Document Reviewed: 01/12/2008 [facility] Patient Information 2012 [place], [place].

## 2011-07-05 NOTE — ED Notes (Signed)
Pt in from home with fall and laceration to the back of head states hit head on coffee table denies loc denies n/v presents with about 2cm laceration to the back of head bleeding controlled at present time

## 2011-07-05 NOTE — ED Provider Notes (Signed)
History     CSN: [id number]  Arrival date & time 07/05/11  1255   First MD Initiated Contact with Patient 07/05/11 1319      Chief Complaint  Patient presents with  . Fall  . Laceration    (Consider location/radiation/quality/duration/timing/severity/associated sxs/prior treatment) The history is provided by the patient and a relative.  78 y/o F on coumadin therapy presents to Ed with c/c of fall and scalp laceration. Was attempting to kill ants in her sunroom just PTA when she lost her balance and fell backward, striking her head on a coffee table, sustaining laceration with bleeding that has been controlled prior to my assessment. Fell onto carpeted floor. Denies LOC, full recall of events.Mild aching HA. Denies visual disturbance, N/V, confusion. Denies neck pain. No other physical complaints. Has been ambulatory since fall.   Past Medical History  Diagnosis Date  . Gout   . Bilateral swelling of feet   . High blood pressure   . Thyroid disease     History reviewed. No pertinent past surgical history.  No family history on file.  History  Substance Use Topics  . Smoking status: Unknown If Ever Smoked  . Smokeless tobacco: Not on file  . Alcohol Use: No     Review of Systems 10 systems reviewed and are negative for acute change except as noted in the HPI.  Allergies  Review of patient's allergies indicates no active allergies.  Home Medications   Current Outpatient Rx  Name Route Sig Dispense Refill  . DILTIAZEM HCL ER COATED BEADS 240 MG PO CP24 Oral Take 1 capsule (240 mg total) by mouth daily. 30 capsule 3  . LEVOTHYROXINE SODIUM 75 MCG PO TABS Oral Take 75 mcg by mouth daily.      . WARFARIN SODIUM 5 MG PO TABS Oral Take 5 mg by mouth See admin instructions. Monday and Friday she takes a full tablet,tuesday Wednesday Thursday Saturday and Sunday she takes 1/2 tablet      BP 150/66  Pulse 75  Temp(Src) 98.7 F (37.1 C) (Oral)  Resp 18  SpO2  97%  Physical Exam  Nursing note reviewed. Constitutional: She is oriented to person, place, and time. She appears well-developed and well-nourished. No distress.       BP 150/66  Pulse 75  Temp(Src) 98.7 F (37.1 C) (Oral)  Resp 18  SpO2 97% VS reviewed and are sig for slight HTN.  HENT:  Head: Normocephalic.    Right Ear: External ear normal.  Left Ear: External ear normal.       MMM  Eyes: Conjunctivae and EOM are normal. Pupils are equal, round, and reactive to light.  Neck: Normal range of motion. Neck supple. No spinous process tenderness and no muscular tenderness present.  Cardiovascular: Normal rate and regular rhythm.   Pulmonary/Chest: Effort normal and breath sounds normal. No respiratory distress. She has no wheezes.  Abdominal: Soft. Bowel sounds are normal. She exhibits no distension. There is no tenderness.  Musculoskeletal: She exhibits no edema and no tenderness.  Neurological: She is alert and oriented to person, place, and time. No cranial nerve deficit (3-12 intact). Coordination (F-N intact bilaterally) normal.       Normal, steady gait. Speech clear.  Skin: Skin is warm and dry. She is not diaphoretic.       See HENT exam  Psychiatric: She has a normal mood and affect.    ED Course  Procedures (including critical care time) LACERATION REPAIR Performed by:  Grettell Ransdell Consent: Verbal consent obtained. Risks and benefits: risks, benefits and alternatives were discussed Patient identity confirmed: provided demographic data Time out performed prior to procedure Prepped and Draped in normal sterile fashion Wound explored  Laceration Location: right occiput  Laceration Length: 2cm  No Foreign Bodies seen or palpated  Anesthesia: none   Irrigation method: syringe Amount of cleaning: standard  Skin closure: staples  Number of sutures or staples: 2  Technique: staples  Patient tolerance: Patient tolerated the procedure well with no  immediate complications.  Labs Reviewed  PROTIME-INR - Abnormal; Notable for the following:    Prothrombin Time 18.4 (*)    INR 1.50 (*)    All other components within normal limits  CBC   Ct Head Wo Contrast  07/05/2011  *RADIOLOGY REPORT*  Clinical Data: Fall  CT HEAD WITHOUT CONTRAST,CT CERVICAL SPINE WITHOUT CONTRAST  Technique:  Contiguous axial images were obtained from the base of the skull through the vertex without contrast.,Technique: Multidetector CT imaging of the cervical spine was performed. Multiplanar CT image reconstructions were also generated.  Comparison: None.  Findings: No skull fracture is noted.  Paranasal sinuses and mastoid air cells are unremarkable.  Moderate cerebral atrophy.  No intracranial hemorrhage, mass effect or midline shift.  Small amount of scalp swelling and tiny amount of subcutaneous air noted in the right posterior parietal region axial image 12.  Periventricular and patchy subcortical white matter decreased attenuation is probable due to chronic small vessel ischemic changes.  No acute infarction.  No mass lesion is noted on this unenhanced scan.  IMPRESSION: No acute intracranial abnormality.  Moderate cerebral atrophy. Scalp swelling and small amount of subcutaneous air noted in the right posterior parietal region.  Clinical correlation is necessary.  Periventricular and subcortical white matter decreased attenuation probable due to chronic small vessel ischemic changes.  CT cervical spine without IV contrast findings:  Axial images of the cervical spine shows no acute fracture or subluxation.  There is no pneumothorax in visualized lung apices.  Computer processed images shows diffuse osteopenia.  Mild disc space flattening noted at C6-C7 level with mild anterior spurring. Mild calcification of the anterior longitudinal ligament at C6-C7 level. No prevertebral soft tissue swelling.  Cervical airway is patent. Multilevel facet degenerative changes are noted.   Impression: 1.  No acute fracture or subluxation.  Degenerative changes as described above.  Original Report Authenticated By: Natasha Mead, M.D.   Ct Cervical Spine Wo Contrast  07/05/2011  *RADIOLOGY REPORT*  Clinical Data: Fall  CT HEAD WITHOUT CONTRAST,CT CERVICAL SPINE WITHOUT CONTRAST  Technique:  Contiguous axial images were obtained from the base of the skull through the vertex without contrast.,Technique: Multidetector CT imaging of the cervical spine was performed. Multiplanar CT image reconstructions were also generated.  Comparison: None.  Findings: No skull fracture is noted.  Paranasal sinuses and mastoid air cells are unremarkable.  Moderate cerebral atrophy.  No intracranial hemorrhage, mass effect or midline shift.  Small amount of scalp swelling and tiny amount of subcutaneous air noted in the right posterior parietal region axial image 12.  Periventricular and patchy subcortical white matter decreased attenuation is probable due to chronic small vessel ischemic changes.  No acute infarction.  No mass lesion is noted on this unenhanced scan.  IMPRESSION: No acute intracranial abnormality.  Moderate cerebral atrophy. Scalp swelling and small amount of subcutaneous air noted in the right posterior parietal region.  Clinical correlation is necessary.  Periventricular and subcortical white matter decreased  attenuation probable due to chronic small vessel ischemic changes.  CT cervical spine without IV contrast findings:  Axial images of the cervical spine shows no acute fracture or subluxation.  There is no pneumothorax in visualized lung apices.  Computer processed images shows diffuse osteopenia.  Mild disc space flattening noted at C6-C7 level with mild anterior spurring. Mild calcification of the anterior longitudinal ligament at C6-C7 level. No prevertebral soft tissue swelling.  Cervical airway is patent. Multilevel facet degenerative changes are noted.  Impression: 1.  No acute fracture or  subluxation.  Degenerative changes as described above.  Original Report Authenticated By: Natasha Mead, M.D.     Dx 1: Fall Dx 2: Head laceration Dx 3: Subtherapeutic INR   MDM  Elderly female, lost footing, fell. Full recall without LOC. Neuro and motor exam without gross deficit. CT head/neck (on coumadin)- neg for hemorrhage or fx. INR subtherapeutic- pt to f/u on this with PCP. No anemia on CBC. Will d/c home with family.        Shaaron Adler, [place] 07/05/11 [phone]

## 2011-07-07 NOTE — ED Provider Notes (Signed)
Medical screening examination/treatment/procedure(s) were conducted as a shared visit with non-physician practitioner(s) and myself.  I personally evaluated the patient during the encounter   Gerhard Munch, MD 07/07/11 1635

## 2011-07-11 ENCOUNTER — Encounter (HOSPITAL_COMMUNITY): Payer: Self-pay

## 2011-07-11 ENCOUNTER — Emergency Department (HOSPITAL_COMMUNITY)
Admission: EM | Admit: 2011-07-11 | Discharge: 2011-07-11 | Disposition: A | Discharge status: Home / Self Care | Payer: Medicare Other | Attending: Emergency Medicine | Admitting: Emergency Medicine

## 2011-07-11 DIAGNOSIS — Z4802 Encounter for removal of sutures: Secondary | ICD-10-CM | POA: Insufficient documentation

## 2011-07-11 DIAGNOSIS — I10 Essential (primary) hypertension: Secondary | ICD-10-CM | POA: Diagnosis not present

## 2011-07-11 DIAGNOSIS — E079 Disorder of thyroid, unspecified: Secondary | ICD-10-CM | POA: Diagnosis not present

## 2011-07-11 DIAGNOSIS — Z79899 Other long term (current) drug therapy: Secondary | ICD-10-CM | POA: Insufficient documentation

## 2011-07-11 DIAGNOSIS — Z7901 Long term (current) use of anticoagulants: Secondary | ICD-10-CM | POA: Diagnosis not present

## 2011-07-11 NOTE — ED Notes (Signed)
Here for staple removal to RT side of head.  Was advised to return today or tomorrow for this.  Pt denies any issues or problems.

## 2011-07-11 NOTE — Discharge Instructions (Signed)
Scar Minimization You will have a scar anytime you have surgery and a cut is made in the skin or you have something removed from your skin (mole, skin cancer, cyst). Although scars are unavoidable following surgery, there are ways to minimize their appearance. It is important to follow all the instructions you receive from your caregiver about wound care. How your wound heals will influence the appearance of your scar. If you do not follow the wound care instructions as directed, complications such as infection may occur. Wound instructions include keeping the wound clean, moist, and not letting the wound form a scab. Some people form scars that are raised and lumpy (hypertrophic) or larger than the initial wound (keloidal). HOME CARE INSTRUCTIONS   Follow wound care instructions as directed.   Keep the wound clean by washing it with soap and water.   Keep the wound moist with provided antibiotic cream or petroleum jelly until completely healed. Moisten twice a day for about 2 weeks.   Get stitches (sutures) taken out at the scheduled time.   Avoid touching or manipulating your wound unless needed. Wash your hands thoroughly before and after touching your wound.   Follow all restrictions such as limits on exercise or work. This depends on where your scar is located.   Keep the scar protected from sunburn. Cover the scar with sunscreen/sunblock with SPF 30 or higher.   Gently massage the scar using a circular motion to help minimize the appearance of the scar. Do this only after the wound has closed and all the sutures have been removed.   For hypertrophic or keloidal scars, there are several ways to treat and minimize their appearance. Methods include compression therapy, intralesional corticosteroids, laser therapy, or surgery. These methods are performed by your caregiver.  Remember that the scar may appear lighter or darker than your normal skin color. This difference in color should even  out with time. SEEK MEDICAL CARE IF:   You have a fever.   You develop signs of infection such as pain, redness, pus, and warmth.   You have questions or concerns.  Document Released: 07/03/2009 Document Revised: 01/02/2011 Document Reviewed: 07/03/2009 [employer] Patient Information 2012 [employer]. 

## 2011-07-11 NOTE — ED Provider Notes (Signed)
History     CSN: [id number]  Arrival date & time 07/11/11  1336   First MD Initiated Contact with Patient 07/11/11 1351      Chief Complaint  Patient presents with  . Suture / Staple Removal    (Consider location/radiation/quality/duration/timing/severity/associated sxs/prior treatment) HPI  Patient to the ED for staple removal to right parietal region of scalp. Patient fell over one week ago and obtained 2 staples. She denies any complications, increased pain, fevers, chills, nausea, vomiting, increased weakness. No puss or bleeding noted from site.  Past Medical History  Diagnosis Date  . Gout   . Bilateral swelling of feet   . High blood pressure   . Thyroid disease     History reviewed. No pertinent past surgical history.  History reviewed. No pertinent family history.  History  Substance Use Topics  . Smoking status: Never Smoker   . Smokeless tobacco: Not on file  . Alcohol Use: No    OB History    Grav Para Term Preterm Abortions TAB SAB Ect Mult Living                  Review of Systems   HEENT: denies blurry vision or change in hearing PULMONARY: Denies difficulty breathing and SOB CARDIAC: denies chest pain or heart palpitations MUSCULOSKELETAL:  denies being unable to ambulate ABDOMEN AL: denies abdominal pain GU: denies loss of bowel or urinary control NEURO: denies numbness and tingling in extremities SKIN: no new rashes      Allergies  Review of patient's allergies indicates no known allergies.  Home Medications   Current Outpatient Rx  Name Route Sig Dispense Refill  . DILTIAZEM HCL ER COATED BEADS 240 MG PO CP24 Oral Take 1 capsule (240 mg total) by mouth daily. 30 capsule 3  . LEVOTHYROXINE SODIUM 75 MCG PO TABS Oral Take 75 mcg by mouth daily.      . WARFARIN SODIUM 5 MG PO TABS Oral Take 5 mg by mouth See admin instructions. Monday and Friday she takes a full tablet,tuesday Wednesday Thursday Saturday and Sunday she takes 1/2  tablet      BP 141/72  Pulse 79  Temp 98.3 F (36.8 C) (Oral)  Resp 18  SpO2 98%  Physical Exam  Nursing note and vitals reviewed. Constitutional: She appears well-developed and well-nourished. No distress.  HENT:  Head: Normocephalic and atraumatic.         Wound well healed, no signs of infection noted  Eyes: Pupils are equal, round, and reactive to light.  Neck: Normal range of motion. Neck supple.  Cardiovascular: Normal rate and regular rhythm.   Pulmonary/Chest: Effort normal.  Abdominal: Soft.  Neurological: She is alert.  Skin: Skin is warm and dry.    ED Course  Procedures (including critical care time)  Labs Reviewed - No data to display No results found.   1. Encounter for staple removal       MDM  No tenderness over wound. 2 staples removied without any complications.  Pt has been advised of the symptoms that warrant their return to the ED. Patient has voiced understanding and has agreed to follow-up with the PCP or specialist.         Dorthula Matas, PA 07/11/11 1408

## 2011-07-12 NOTE — ED Provider Notes (Signed)
Medical screening examination/treatment/procedure(s) were performed by non-physician practitioner and as supervising physician I was immediately available for consultation/collaboration.  Rawn Quiroa R. Stevi Hollinshead, MD 07/12/11 0704 

## 2011-07-23 DIAGNOSIS — Z7901 Long term (current) use of anticoagulants: Secondary | ICD-10-CM | POA: Diagnosis not present

## 2011-07-23 DIAGNOSIS — I4891 Unspecified atrial fibrillation: Secondary | ICD-10-CM | POA: Diagnosis not present

## 2011-08-06 DIAGNOSIS — Z7901 Long term (current) use of anticoagulants: Secondary | ICD-10-CM | POA: Diagnosis not present

## 2011-08-06 DIAGNOSIS — I4891 Unspecified atrial fibrillation: Secondary | ICD-10-CM | POA: Diagnosis not present

## 2011-09-11 DIAGNOSIS — I4891 Unspecified atrial fibrillation: Secondary | ICD-10-CM | POA: Diagnosis not present

## 2011-09-11 DIAGNOSIS — Z7901 Long term (current) use of anticoagulants: Secondary | ICD-10-CM | POA: Diagnosis not present

## 2011-10-07 DIAGNOSIS — I4891 Unspecified atrial fibrillation: Secondary | ICD-10-CM | POA: Diagnosis not present

## 2011-10-07 DIAGNOSIS — E039 Hypothyroidism, unspecified: Secondary | ICD-10-CM | POA: Diagnosis not present

## 2011-10-07 DIAGNOSIS — Z7901 Long term (current) use of anticoagulants: Secondary | ICD-10-CM | POA: Diagnosis not present

## 2011-10-07 DIAGNOSIS — I10 Essential (primary) hypertension: Secondary | ICD-10-CM | POA: Diagnosis not present

## 2011-11-06 DIAGNOSIS — I4891 Unspecified atrial fibrillation: Secondary | ICD-10-CM | POA: Diagnosis not present

## 2011-11-06 DIAGNOSIS — Z7901 Long term (current) use of anticoagulants: Secondary | ICD-10-CM | POA: Diagnosis not present

## 2011-12-11 DIAGNOSIS — I4891 Unspecified atrial fibrillation: Secondary | ICD-10-CM | POA: Diagnosis not present

## 2011-12-11 DIAGNOSIS — Z7901 Long term (current) use of anticoagulants: Secondary | ICD-10-CM | POA: Diagnosis not present

## 2012-01-14 DIAGNOSIS — I4891 Unspecified atrial fibrillation: Secondary | ICD-10-CM | POA: Diagnosis not present

## 2012-01-14 DIAGNOSIS — Z7901 Long term (current) use of anticoagulants: Secondary | ICD-10-CM | POA: Diagnosis not present

## 2012-02-24 DIAGNOSIS — I4891 Unspecified atrial fibrillation: Secondary | ICD-10-CM | POA: Diagnosis not present

## 2012-02-24 DIAGNOSIS — Z7901 Long term (current) use of anticoagulants: Secondary | ICD-10-CM | POA: Diagnosis not present

## 2012-03-14 ENCOUNTER — Emergency Department (HOSPITAL_COMMUNITY): Payer: Medicare Other

## 2012-03-14 ENCOUNTER — Inpatient Hospital Stay (HOSPITAL_COMMUNITY)
Admission: EM | Admit: 2012-03-14 | Discharge: 2012-03-17 | DRG: 308 | Disposition: A | Discharge status: Home Health | Payer: Medicare Other | Attending: Internal Medicine | Admitting: Internal Medicine

## 2012-03-14 ENCOUNTER — Encounter (HOSPITAL_COMMUNITY): Payer: Self-pay | Admitting: *Deleted

## 2012-03-14 DIAGNOSIS — I4891 Unspecified atrial fibrillation: Principal | ICD-10-CM

## 2012-03-14 DIAGNOSIS — Z79899 Other long term (current) drug therapy: Secondary | ICD-10-CM | POA: Diagnosis not present

## 2012-03-14 DIAGNOSIS — M109 Gout, unspecified: Secondary | ICD-10-CM | POA: Diagnosis present

## 2012-03-14 DIAGNOSIS — Z7901 Long term (current) use of anticoagulants: Secondary | ICD-10-CM | POA: Diagnosis not present

## 2012-03-14 DIAGNOSIS — R1310 Dysphagia, unspecified: Secondary | ICD-10-CM | POA: Diagnosis not present

## 2012-03-14 DIAGNOSIS — I10 Essential (primary) hypertension: Secondary | ICD-10-CM | POA: Diagnosis not present

## 2012-03-14 DIAGNOSIS — I482 Chronic atrial fibrillation, unspecified: Secondary | ICD-10-CM | POA: Diagnosis present

## 2012-03-14 DIAGNOSIS — E860 Dehydration: Secondary | ICD-10-CM | POA: Diagnosis present

## 2012-03-14 DIAGNOSIS — E039 Hypothyroidism, unspecified: Secondary | ICD-10-CM

## 2012-03-14 DIAGNOSIS — R0609 Other forms of dyspnea: Secondary | ICD-10-CM | POA: Diagnosis not present

## 2012-03-14 DIAGNOSIS — J209 Acute bronchitis, unspecified: Secondary | ICD-10-CM | POA: Diagnosis not present

## 2012-03-14 DIAGNOSIS — N179 Acute kidney failure, unspecified: Secondary | ICD-10-CM

## 2012-03-14 DIAGNOSIS — R0602 Shortness of breath: Secondary | ICD-10-CM | POA: Diagnosis not present

## 2012-03-14 DIAGNOSIS — K225 Diverticulum of esophagus, acquired: Secondary | ICD-10-CM | POA: Diagnosis not present

## 2012-03-14 DIAGNOSIS — J690 Pneumonitis due to inhalation of food and vomit: Secondary | ICD-10-CM

## 2012-03-14 DIAGNOSIS — R059 Cough, unspecified: Secondary | ICD-10-CM | POA: Diagnosis not present

## 2012-03-14 DIAGNOSIS — J40 Bronchitis, not specified as acute or chronic: Secondary | ICD-10-CM

## 2012-03-14 DIAGNOSIS — R0600 Dyspnea, unspecified: Secondary | ICD-10-CM

## 2012-03-14 DIAGNOSIS — J189 Pneumonia, unspecified organism: Secondary | ICD-10-CM | POA: Diagnosis not present

## 2012-03-14 DIAGNOSIS — R05 Cough: Secondary | ICD-10-CM | POA: Diagnosis not present

## 2012-03-14 HISTORY — DX: Cardiac arrhythmia, unspecified: I49.9

## 2012-03-14 HISTORY — DX: Urinary tract infection, site not specified: N39.0

## 2012-03-14 HISTORY — DX: Unspecified Escherichia coli (E. coli) as the cause of diseases classified elsewhere: B96.20

## 2012-03-14 LAB — CBC WITH DIFFERENTIAL/PLATELET
Basophils Relative: 0 % (ref 0–1)
Hemoglobin: 15.6 g/dL — ABNORMAL HIGH (ref 12.0–15.0)
Lymphs Abs: 1.1 10*3/uL (ref 0.7–4.0)
MCHC: 34.7 g/dL (ref 30.0–36.0)
Monocytes Relative: 9 % (ref 3–12)
Neutro Abs: 6.6 10*3/uL (ref 1.7–7.7)
Neutrophils Relative %: 77 % (ref 43–77)
RBC: 4.7 MIL/uL (ref 3.87–5.11)
WBC: 8.6 10*3/uL (ref 4.0–10.5)

## 2012-03-14 LAB — URINALYSIS, ROUTINE W REFLEX MICROSCOPIC
Ketones, ur: 15 mg/dL — AB
Nitrite: NEGATIVE
Protein, ur: NEGATIVE mg/dL
Specific Gravity, Urine: 1.025 (ref 1.005–1.030)
Urobilinogen, UA: 1 mg/dL (ref 0.0–1.0)

## 2012-03-14 LAB — BASIC METABOLIC PANEL
BUN: 17 mg/dL (ref 6–23)
Chloride: 100 mEq/L (ref 96–112)
GFR calc Af Amer: 46 mL/min — ABNORMAL LOW (ref >90)
Potassium: 3.7 mEq/L (ref 3.5–5.1)

## 2012-03-14 LAB — URINE MICROSCOPIC-ADD ON

## 2012-03-14 LAB — MRSA PCR SCREENING: MRSA by PCR: NEGATIVE

## 2012-03-14 LAB — TROPONIN I: Troponin I: <0.3 ng/mL (ref <0.30)

## 2012-03-14 LAB — APTT: aPTT: 45 seconds — ABNORMAL HIGH (ref 24–37)

## 2012-03-14 MED ORDER — ONDANSETRON HCL 4 MG/2ML IJ SOLN: 4.0000 mg | Freq: Four times a day (QID) | INTRAMUSCULAR | Status: DC | PRN

## 2012-03-14 MED ORDER — SODIUM CHLORIDE 0.9 % IJ SOLN
3.0000 mL | Freq: Two times a day (BID) | INTRAMUSCULAR | Status: DC
  Administered 2012-03-15: 10 mL via INTRAVENOUS
  Administered 2012-03-15 – 2012-03-17 (×4): 3 mL via INTRAVENOUS

## 2012-03-14 MED ORDER — DEXTROSE 5 % IV SOLN
500.0000 mg | Freq: Once | INTRAVENOUS | Status: AC
  Administered 2012-03-14: 500 mg via INTRAVENOUS
  Filled 2012-03-14: qty 500

## 2012-03-14 MED ORDER — ACETAMINOPHEN 650 MG RE SUPP: 650.0000 mg | Freq: Four times a day (QID) | RECTAL | Status: DC | PRN

## 2012-03-14 MED ORDER — DEXTROSE 5 % IV SOLN: 1.0000 g | INTRAVENOUS | Status: DC

## 2012-03-14 MED ORDER — ZOLPIDEM TARTRATE 5 MG PO TABS
5.0000 mg | ORAL_TABLET | Freq: Every day | ORAL | Status: DC
  Administered 2012-03-15: 5 mg via ORAL
  Filled 2012-03-14: qty 1

## 2012-03-14 MED ORDER — LEVALBUTEROL HCL 1.25 MG/0.5ML IN NEBU
1.2500 mg | INHALATION_SOLUTION | Freq: Once | RESPIRATORY_TRACT | Status: AC
  Administered 2012-03-14: 1.25 mg via RESPIRATORY_TRACT
  Filled 2012-03-14: qty 0.5

## 2012-03-14 MED ORDER — HYDROCODONE-ACETAMINOPHEN 5-325 MG PO TABS: 1.0000 | ORAL_TABLET | ORAL | Status: DC | PRN

## 2012-03-14 MED ORDER — DILTIAZEM HCL 100 MG IV SOLR
5.0000 mg/h | INTRAVENOUS | Status: AC
  Administered 2012-03-14: 5 mg/h via INTRAVENOUS
  Administered 2012-03-14 – 2012-03-15 (×3): 10 mg/h via INTRAVENOUS

## 2012-03-14 MED ORDER — SODIUM CHLORIDE 0.9 % IV BOLUS (SEPSIS)
500.0000 mL | Freq: Once | INTRAVENOUS | Status: AC
  Administered 2012-03-14: 1000 mL via INTRAVENOUS

## 2012-03-14 MED ORDER — DEXTROSE 5 % IV SOLN
1.0000 g | INTRAVENOUS | Status: DC
  Administered 2012-03-15 – 2012-03-16 (×2): 1 g via INTRAVENOUS
  Filled 2012-03-14 (×3): qty 10

## 2012-03-14 MED ORDER — DEXTROSE 5 % IV SOLN
500.0000 mg | INTRAVENOUS | Status: DC
  Administered 2012-03-15 – 2012-03-16 (×2): 500 mg via INTRAVENOUS
  Filled 2012-03-14 (×2): qty 500

## 2012-03-14 MED ORDER — ACETAMINOPHEN 325 MG PO TABS: 650.0000 mg | ORAL_TABLET | Freq: Four times a day (QID) | ORAL | Status: DC | PRN

## 2012-03-14 MED ORDER — DM-GUAIFENESIN ER 30-600 MG PO TB12
1.0000 | ORAL_TABLET | Freq: Two times a day (BID) | ORAL | Status: DC
  Administered 2012-03-14: 1 via ORAL
  Filled 2012-03-14 (×3): qty 1

## 2012-03-14 MED ORDER — ONDANSETRON HCL 4 MG PO TABS: 4.0000 mg | ORAL_TABLET | Freq: Four times a day (QID) | ORAL | Status: DC | PRN

## 2012-03-14 MED ORDER — SODIUM CHLORIDE 0.9 % IV SOLN
INTRAVENOUS | Status: DC
  Administered 2012-03-14: 19:00:00 via INTRAVENOUS

## 2012-03-14 MED ORDER — ALBUTEROL SULFATE (5 MG/ML) 0.5% IN NEBU: 2.5000 mg | INHALATION_SOLUTION | RESPIRATORY_TRACT | Status: DC | PRN

## 2012-03-14 MED ORDER — SODIUM CHLORIDE 0.9 % IV SOLN
INTRAVENOUS | Status: DC
  Administered 2012-03-14: 1000 mL via INTRAVENOUS
  Administered 2012-03-15 (×2): via INTRAVENOUS

## 2012-03-14 MED ORDER — LEVOTHYROXINE SODIUM 75 MCG PO TABS
75.0000 ug | ORAL_TABLET | Freq: Every day | ORAL | Status: DC
  Administered 2012-03-15 – 2012-03-17 (×3): 75 ug via ORAL
  Filled 2012-03-14 (×5): qty 1

## 2012-03-14 MED ORDER — DEXTROSE 5 % IV SOLN
1.0000 g | Freq: Once | INTRAVENOUS | Status: AC
  Administered 2012-03-14: 1 g via INTRAVENOUS
  Filled 2012-03-14: qty 10

## 2012-03-14 MED ORDER — IPRATROPIUM BROMIDE 0.02 % IN SOLN: 0.5000 mg | RESPIRATORY_TRACT | Status: DC | PRN

## 2012-03-14 MED ORDER — HYDROCOD POLST-CHLORPHEN POLST 10-8 MG/5ML PO LQCR: 5.0000 mL | Freq: Two times a day (BID) | ORAL | Status: DC | PRN

## 2012-03-14 MED ORDER — SODIUM CHLORIDE 0.9 % IV BOLUS (SEPSIS)
500.0000 mL | Freq: Once | INTRAVENOUS | Status: AC
  Administered 2012-03-14: 500 mL via INTRAVENOUS

## 2012-03-14 MED ORDER — DEXTROSE 5 % IV SOLN: 500.0000 mg | INTRAVENOUS | Status: DC

## 2012-03-14 NOTE — H&P (Signed)
[facility] History and Physical  Carrie Morris WUJ:[id number] DOB: 08-07-1921 DOA: 03/14/2012  Referring physician: ER physician PCP: Hoyle Sauer, MD   Chief Complaint: cough and shortness of breath  HPI:  12 year olf female with past medical history of atrial fibrillation on coumadin, hypothyroidism who presented to ED with few day history of  complaints of worsening cough , productive of whitish/yellowish sputum, shortness of breath. No reports of chest pain, no palpitations. No fever or chills reported. No abdominal pain, no nausea or vomiting. No lightheadedness or dizziness or loss of consciousness. No reports of blood in stool or urine. No diarrhea or constipation. In ED, evaluation included CXR which did not reveal acute cardiopulmonary process. BMET revealed creatinine elevation of 1.18 (on previous admission was WNL), INR at 2.06. Her EKG revealed she is in A fib with rapid ventricular response and she was started on Cardizem drip in ED.  Assessment and Plan:  Principal Problem:   Atrial fibrillation with rapid ventricular response  Patient started on Cardizem drip in ED. Since still tachycardic we will admit pt to SDU and continue Cardizem drip for now  EKG shows A fib with RVR  Coumadin per pharmacy. INR at therapeutic level on admission  Check TSH level Active Problems:   Shortness of breath  Likely due to community acquired pneumonia  Nebulizer  treatments Q 4 hours PRN  Oxygen support via nasal canula to keep O2 saturation above 90%  Antibiotics: azithromycin and ceftriaxone    HTN (hypertension)  Pt on Cardizem drip  BP now 154/109 with HR 11   Hypothyroidism  Check TSH  Continue levothyroxine   Acute kidney failure  Likely dehydration for acute illness/infection   Continue IV fluids  Follow up BMP in am   CAP (community acquired pneumonia)  Started azithromycin and ceftriaxone for possible pneumonia  Pneumonia order set in  place  Follow up flu test, legionella, strep pneumoniae  Follow up blood culture results  Code Status: Full Family Communication: Pt at bedside Disposition Plan: Admit for further evaluation  Manson Passey, MD  [facility] Pager [phone]  If 7PM-7AM, please contact night-coverage [email or web address] Password [email or web address] 03/14/2012, 8:01 PM   Review of Systems:  Constitutional: Negative for fever, chills and malaise/fatigue. Negative for diaphoresis.  HENT: Negative for hearing loss, ear pain, nosebleeds, congestion, sore throat, neck pain, tinnitus and ear discharge.   Eyes: Negative for blurred vision, double vision, photophobia, pain, discharge and redness.  Respiratory: positive for cough and shortness of breath.   Cardiovascular: Negative for chest pain, palpitations, orthopnea, claudication and leg swelling.  Gastrointestinal: Negative for nausea, vomiting and abdominal pain. Negative for heartburn, constipation, blood in stool and melena.  Genitourinary: Negative for dysuria, urgency, frequency, hematuria and flank pain.  Musculoskeletal: Negative for myalgias, back pain, joint pain and falls.  Skin: Negative for itching and rash.  Neurological: Negative for dizziness and weakness. Negative for tingling, tremors, sensory change, speech change, focal weakness, loss of consciousness and headaches.  Endo/Heme/Allergies: Negative for environmental allergies and polydipsia. Does not bruise/bleed easily.  Psychiatric/Behavioral: Negative for suicidal ideas. The patient is not nervous/anxious.      Past Medical History  Diagnosis Date  . Gout   . Bilateral swelling of feet   . High blood pressure   . Thyroid disease    History reviewed. No pertinent past surgical history. Social History:  reports that she has never smoked. She does not have any smokeless tobacco history on file. She reports that she  does not drink alcohol or use illicit drugs.  No Known Allergies  Family History: htn in  mother  Prior to Admission medications   Medication Sig Start Date End Date Taking? Authorizing Provider  diltiazem (CARDIZEM CD) 240 MG 24 hr capsule Take 1 capsule (240 mg total) by mouth daily. 12/05/10 03/14/12 Yes Ravisankar R Avva, MD  levothyroxine (SYNTHROID, LEVOTHROID) 75 MCG tablet Take 75 mcg by mouth daily.     Yes Historical Provider, MD  warfarin (COUMADIN) 5 MG tablet Take 2.5-5 mg by mouth See admin instructions. Monday, wed, and Friday she takes a full tablet (5mg ) ,tuesday Thursday Saturday and Sunday she takes 1/2 tablet (2.5mg )   Yes Historical Provider, MD   Physical Exam: Filed Vitals:   03/14/12 1815 03/14/12 1830 03/14/12 1900 03/14/12 1930  BP: 131/70 113/101 136/72 113/64  Pulse: 85 101 82   Temp:      TempSrc:      Resp: 24 22 29 20   SpO2: 97% 95% 95%     Physical Exam  Constitutional: Appears well-developed and well-nourished. No distress.  HENT: Normocephalic. External right and left ear normal. Oropharynx is clear and moist.  Eyes: Conjunctivae and EOM are normal. PERRLA, no scleral icterus.  Neck: Normal ROM. Neck supple. No JVD. No tracheal deviation. No thyromegaly.  CVS: irregular rhythm tachycardiac, (+) S1 and S2.  Pulmonary: diminished breath sounds, wheezing appreciated in upper nad mid lung lobes.  Abdominal: Soft. BS +,  no distension, tenderness, rebound or guarding.  Musculoskeletal: Normal range of motion. No edema and no tenderness.  Lymphadenopathy: No lymphadenopathy noted, cervical, inguinal. Neuro: Alert. Normal reflexes, muscle tone coordination. No cranial nerve deficit. Skin: Skin is warm and dry. No rash noted. Not diaphoretic. No erythema. No pallor.  Psychiatric: Normal mood and affect. Behavior, judgment, thought content normal.   Labs on Admission:  Basic Metabolic Panel:  Recent Labs Lab 03/14/12 1734  NA 137  K 3.7  CL 100  CO2 23  GLUCOSE 106*  BUN 17  CREATININE 1.18*  CALCIUM 9.4   Liver Function Tests: No  results found for this basename: AST, ALT, ALKPHOS, BILITOT, PROT, ALBUMIN,  in the last 168 hours No results found for this basename: LIPASE, AMYLASE,  in the last 168 hours No results found for this basename: AMMONIA,  in the last 168 hours CBC:  Recent Labs Lab 03/14/12 1734  WBC 8.6  NEUTROABS 6.6  HGB 15.6*  HCT 44.9  MCV 95.5  PLT 245   Cardiac Enzymes:  Recent Labs Lab 03/14/12 1734  TROPONINI <0.30   BNP: No components found with this basename: POCBNP,  CBG: No results found for this basename: GLUCAP,  in the last 168 hours  Radiological Exams on Admission: Dg Chest 2 View 03/14/2012   IMPRESSION: No acute cardiopulmonary disease.  Mild cardiomegaly.  Large hiatal hernia.  Compressive atelectasis in the left lower lobe.   Original Report Authenticated By: Charlett Nose, M.D.     Time spent: 75 minutes

## 2012-03-14 NOTE — ED Provider Notes (Addendum)
History     CSN: [id number]  Arrival date & time 03/14/12  1559   First MD Initiated Contact with Patient 03/14/12 1702      Chief Complaint  Patient presents with  . Cough    (Consider location/radiation/quality/duration/timing/severity/associated sxs/prior treatment) Patient is a 79 y.o. female presenting with cough.  Cough  Pt brought to the ED by son for evaluation of cough. She has had worsening cough for the last few days, associated with SOB, productive of white sputum. No associate CP or fever. Son checked her HR at pharmacy today and it was in the 130s. She had a similar issue with HR when she was admitted for urosepsis about 18months ago. Found to be afib/flutter. Started on Coumadin and Cardizem then, but has reportedly been in NSR since.   Past Medical History  Diagnosis Date  . Gout   . Bilateral swelling of feet   . High blood pressure   . Thyroid disease     History reviewed. No pertinent past surgical history.  No family history on file.  History  Substance Use Topics  . Smoking status: Never Smoker   . Smokeless tobacco: Not on file  . Alcohol Use: No    OB History   Grav Para Term Preterm Abortions TAB SAB Ect Mult Living                  Review of Systems  Respiratory: Positive for cough.    All other systems reviewed and are negative except as noted in HPI.   Allergies  Review of patient's allergies indicates no known allergies.  Home Medications   Current Outpatient Rx  Name  Route  Sig  Dispense  Refill  . EXPIRED: diltiazem (CARDIZEM CD) 240 MG 24 hr capsule   Oral   Take 1 capsule (240 mg total) by mouth daily.   30 capsule   3   . levothyroxine (SYNTHROID, LEVOTHROID) 75 MCG tablet   Oral   Take 75 mcg by mouth daily.           Marland Kitchen warfarin (COUMADIN) 5 MG tablet   Oral   Take 5 mg by mouth See admin instructions. Monday and Friday she takes a full tablet,tuesday Wednesday Thursday Saturday and Sunday she takes 1/2 tablet           BP 142/84  Pulse 70  Temp(Src) 98.3 F (36.8 C) (Oral)  SpO2 95%  Physical Exam  Nursing note and vitals reviewed. Constitutional: She is oriented to person, place, and time. She appears well-developed and well-nourished.  HENT:  Head: Normocephalic and atraumatic.  Eyes: EOM are normal. Pupils are equal, round, and reactive to light.  Neck: Normal range of motion. Neck supple.  Cardiovascular: Intact distal pulses.   Tachycardia Irregular  Pulmonary/Chest: Effort normal. No respiratory distress. She has wheezes. She has rales.  Abdominal: Bowel sounds are normal. She exhibits no distension. There is no tenderness.  Musculoskeletal: Normal range of motion. She exhibits no edema and no tenderness.  Neurological: She is alert and oriented to person, place, and time. She has normal strength. No cranial nerve deficit or sensory deficit.  Skin: Skin is warm and dry. No rash noted.  Psychiatric: She has a normal mood and affect.    ED Course  Procedures (including critical care time)  Labs Reviewed  CBC WITH DIFFERENTIAL - Abnormal; Notable for the following:    Hemoglobin 15.6 (*)    All other components within normal limits  BASIC METABOLIC PANEL - Abnormal; Notable for the following:    Glucose, Bld 106 (*)    Creatinine, Ser 1.18 (*)    GFR calc non Af Amer 40 (*)    GFR calc Af Amer 46 (*)    All other components within normal limits  PROTIME-INR - Abnormal; Notable for the following:    Prothrombin Time 22.4 (*)    INR 2.06 (*)    All other components within normal limits  APTT - Abnormal; Notable for the following:    aPTT 45 (*)    All other components within normal limits  URINALYSIS, ROUTINE W REFLEX MICROSCOPIC - Abnormal; Notable for the following:    APPearance CLOUDY (*)    Hgb urine dipstick MODERATE (*)    Bilirubin Urine SMALL (*)    Ketones, ur 15 (*)    Leukocytes, UA SMALL (*)    All other components within normal limits  URINE  MICROSCOPIC-ADD ON - Abnormal; Notable for the following:    Squamous Epithelial / LPF FEW (*)    Bacteria, UA FEW (*)    All other components within normal limits  MRSA PCR SCREENING  CULTURE, BLOOD (ROUTINE X 2)  CULTURE, BLOOD (ROUTINE X 2)  CULTURE, EXPECTORATED SPUTUM-ASSESSMENT  GRAM STAIN  URINE CULTURE  TROPONIN I  TSH  LEGIONELLA ANTIGEN, URINE  STREP PNEUMONIAE URINARY ANTIGEN  INFLUENZA PANEL BY PCR  COMPREHENSIVE METABOLIC PANEL  MAGNESIUM  PHOSPHORUS  CBC WITH DIFFERENTIAL  PROTIME-INR  APTT  COMPREHENSIVE METABOLIC PANEL  CBC   Dg Chest 2 View  03/14/2012  *RADIOLOGY REPORT*  Clinical Data: Cough, shortness of breath.  CHEST - 2 VIEW  Comparison: 11/27/2010  Findings: Large hiatal hernia, stable.  Heart is mildly enlarged. Hyperinflation of the lungs.  Left lower lobe airspace opacity, most likely compressive atelectasis.  No confluent opacity on the right.  Mild fullness of the right hilum felt to be vascular. Deviation of the trachea to the right likely related to tortuosity and ectasia of the aorta.  No visible effusions.  No acute bony abnormality.  IMPRESSION: No acute cardiopulmonary disease.  Mild cardiomegaly.  Large hiatal hernia.  Compressive atelectasis in the left lower lobe.   Original Report Authenticated By: Charlett Nose, M.D.          MDM   Date: 03/14/2012  Rate: 166  Rhythm: atrial fibrillation and RVR  QRS Axis: left  Intervals: normal  ST/T Wave abnormalities: nonspecific ST/T changes  Conduction Disutrbances:left anterior fascicular block  Narrative Interpretation:   Old EKG Reviewed: changes noted, previously in NSR  Pt remains in afib, but HR controlled on Cardizem drip. Will give Xopanex for wheezing, admit for further eval. CXR neg for PNA  CRITICAL CARE Performed by: Pollyann Savoy.   Total critical care time: 30  Critical care time was exclusive of separately billable procedures and treating other patients.  Critical  care was necessary to treat or prevent imminent or life-threatening deterioration.  Critical care was time spent personally by me on the following activities: development of treatment plan with patient and/or surrogate as well as nursing, discussions with consultants, evaluation of patient's response to treatment, examination of patient, obtaining history from patient or surrogate, ordering and performing treatments and interventions, ordering and review of laboratory studies, ordering and review of radiographic studies, pulse oximetry and re-evaluation of patient's condition.       Charles B. Bernette Mayers, MD 03/14/12 2334  Bonnita Levan. Bernette Mayers, MD 03/14/12 2336

## 2012-03-14 NOTE — ED Notes (Signed)
Pt reports cough x2-3 days. White, productive cough. Sts pain only in stomach muscles intermittently due to cough. Denies n/v, fever.

## 2012-03-14 NOTE — Progress Notes (Signed)
ANTICOAGULATION CONSULT NOTE - Initial Consult  Pharmacy Consult for Warfarin Indication: atrial fibrillation  No Known Allergies  Patient Measurements:     Vital Signs: Temp: 98.3 F (36.8 C) (02/16 1643) Temp src: Oral (02/16 1643) BP: 159/94 mmHg (02/16 2100) Pulse Rate: 128 (02/16 2100)  Labs:  Recent Labs  03/14/12 1734  HGB 15.6*  HCT 44.9  PLT 245  APTT 45*  LABPROT 22.4*  INR 2.06*  CREATININE 1.18*  TROPONINI <0.30    The CrCl is unknown because both a height and weight (above a minimum accepted value) are required for this calculation.   Medical History: Past Medical History  Diagnosis Date  . Gout   . Bilateral swelling of feet   . High blood pressure   . Thyroid disease     Medications:  Scheduled:  . [COMPLETED] azithromycin  500 mg Intravenous Once  . azithromycin  500 mg Intravenous Q24H  . [COMPLETED] cefTRIAXone (ROCEPHIN)  IV  1 g Intravenous Once  . cefTRIAXone (ROCEPHIN)  IV  1 g Intravenous Q24H  . dextromethorphan-guaiFENesin  1 tablet Oral BID  . [COMPLETED] levalbuterol  1.25 mg Nebulization Once  . levothyroxine  75 mcg Oral QAC breakfast  . [COMPLETED] sodium chloride  500 mL Intravenous Once  . [COMPLETED] sodium chloride  500 mL Intravenous Once  . sodium chloride  3 mL Intravenous Q12H  . zolpidem  5 mg Oral QHS  . [DISCONTINUED] azithromycin  500 mg Intravenous Q24H  . [DISCONTINUED] cefTRIAXone (ROCEPHIN)  IV  1 g Intravenous Q24H   Infusions:  . sodium chloride 1,000 mL (03/14/12 2316)  . diltiazem (CARDIZEM) infusion 10 mg/hr (03/14/12 1756)  . [DISCONTINUED] sodium chloride Stopped (03/14/12 2054)    Assessment:  78 year old female on Warfarin PTA for h/o AFib  Pt presents with complaint of cough and SOB and admitted for CAP (Azithromycin and Ceftriaxone started)  INR upon admission = 2.06  PTA pt taking warfarin 2.5mg  daily except 5 mg MWF - last dose taken 2/16  Goal of Therapy:  INR 2-3   Plan:    INR therapeutic therefore no additional warfarin needed tonight  Check daily PT/INR  Continue warfarin therapy on 2/17  Bricia Taher, Joselyn Glassman, PharmD 03/14/2012,11:59 PM

## 2012-03-15 ENCOUNTER — Inpatient Hospital Stay (HOSPITAL_COMMUNITY): Payer: Medicare Other

## 2012-03-15 ENCOUNTER — Encounter (HOSPITAL_COMMUNITY): Payer: Self-pay

## 2012-03-15 DIAGNOSIS — R0600 Dyspnea, unspecified: Secondary | ICD-10-CM | POA: Diagnosis present

## 2012-03-15 DIAGNOSIS — E039 Hypothyroidism, unspecified: Secondary | ICD-10-CM

## 2012-03-15 DIAGNOSIS — R0989 Other specified symptoms and signs involving the circulatory and respiratory systems: Secondary | ICD-10-CM

## 2012-03-15 DIAGNOSIS — R0609 Other forms of dyspnea: Secondary | ICD-10-CM

## 2012-03-15 LAB — COMPREHENSIVE METABOLIC PANEL
ALT: 10 U/L (ref 0–35)
ALT: 10 U/L (ref 0–35)
AST: 22 U/L (ref 0–37)
Albumin: 3 g/dL — ABNORMAL LOW (ref 3.5–5.2)
CO2: 21 mEq/L (ref 19–32)
Calcium: 8.2 mg/dL — ABNORMAL LOW (ref 8.4–10.5)
Calcium: 8.4 mg/dL (ref 8.4–10.5)
Creatinine, Ser: 1.02 mg/dL (ref 0.50–1.10)
GFR calc Af Amer: 55 mL/min — ABNORMAL LOW (ref >90)
GFR calc Af Amer: 57 mL/min — ABNORMAL LOW (ref >90)
GFR calc non Af Amer: 48 mL/min — ABNORMAL LOW (ref >90)
Glucose, Bld: 111 mg/dL — ABNORMAL HIGH (ref 70–99)
Glucose, Bld: 112 mg/dL — ABNORMAL HIGH (ref 70–99)
Potassium: 3.8 mEq/L (ref 3.5–5.1)
Sodium: 136 mEq/L (ref 135–145)
Sodium: 137 mEq/L (ref 135–145)
Total Protein: 6.4 g/dL (ref 6.0–8.3)
Total Protein: 6.5 g/dL (ref 6.0–8.3)

## 2012-03-15 LAB — CBC
HCT: 37.7 % (ref 36.0–46.0)
Hemoglobin: 12.3 g/dL (ref 12.0–15.0)
MCHC: 32.6 g/dL (ref 30.0–36.0)
MCV: 96.7 fL (ref 78.0–100.0)

## 2012-03-15 LAB — APTT: aPTT: 43 seconds — ABNORMAL HIGH (ref 24–37)

## 2012-03-15 LAB — STREP PNEUMONIAE URINARY ANTIGEN: Strep Pneumo Urinary Antigen: NEGATIVE

## 2012-03-15 LAB — CBC WITH DIFFERENTIAL/PLATELET
Basophils Absolute: 0 10*3/uL (ref 0.0–0.1)
Lymphocytes Relative: 16 % (ref 12–46)
Lymphs Abs: 1.3 10*3/uL (ref 0.7–4.0)
Neutro Abs: 6.2 10*3/uL (ref 1.7–7.7)
Neutrophils Relative %: 75 % (ref 43–77)
Platelets: 195 10*3/uL (ref 150–400)
RBC: 3.85 MIL/uL — ABNORMAL LOW (ref 3.87–5.11)
RDW: 13.6 % (ref 11.5–15.5)
WBC: 8.2 10*3/uL (ref 4.0–10.5)

## 2012-03-15 LAB — LEGIONELLA ANTIGEN, URINE

## 2012-03-15 LAB — PROTIME-INR
INR: 2.26 — ABNORMAL HIGH (ref 0.00–1.49)
INR: 2.32 — ABNORMAL HIGH (ref 0.00–1.49)
Prothrombin Time: 24 seconds — ABNORMAL HIGH (ref 11.6–15.2)

## 2012-03-15 LAB — EXPECTORATED SPUTUM ASSESSMENT W GRAM STAIN, RFLX TO RESP C

## 2012-03-15 LAB — INFLUENZA PANEL BY PCR (TYPE A & B)
H1N1 flu by pcr: NOT DETECTED
Influenza A By PCR: NEGATIVE

## 2012-03-15 LAB — TSH: TSH: 0.736 u[IU]/mL (ref 0.350–4.500)

## 2012-03-15 LAB — PHOSPHORUS: Phosphorus: 2.9 mg/dL (ref 2.3–4.6)

## 2012-03-15 LAB — GLUCOSE, CAPILLARY

## 2012-03-15 MED ORDER — WARFARIN - PHARMACIST DOSING INPATIENT: Freq: Every day | Status: DC

## 2012-03-15 MED ORDER — GUAIFENESIN ER 600 MG PO TB12
600.0000 mg | ORAL_TABLET | Freq: Two times a day (BID) | ORAL | Status: DC
  Administered 2012-03-15 – 2012-03-17 (×4): 600 mg via ORAL
  Filled 2012-03-15 (×6): qty 1

## 2012-03-15 MED ORDER — DILTIAZEM HCL 60 MG PO TABS
60.0000 mg | ORAL_TABLET | Freq: Four times a day (QID) | ORAL | Status: DC
  Administered 2012-03-15 – 2012-03-17 (×10): 60 mg via ORAL
  Filled 2012-03-15 (×13): qty 1

## 2012-03-15 MED ORDER — WARFARIN SODIUM 2.5 MG PO TABS
2.5000 mg | ORAL_TABLET | Freq: Once | ORAL | Status: AC
  Administered 2012-03-15: 2.5 mg via ORAL
  Filled 2012-03-15: qty 1

## 2012-03-15 MED ORDER — LEVALBUTEROL HCL 0.63 MG/3ML IN NEBU
0.6300 mg | INHALATION_SOLUTION | Freq: Four times a day (QID) | RESPIRATORY_TRACT | Status: DC | PRN
  Filled 2012-03-15: qty 3

## 2012-03-15 MED ORDER — GUAIFENESIN 100 MG/5ML PO SOLN
5.0000 mL | ORAL | Status: DC | PRN
  Administered 2012-03-15 – 2012-03-17 (×3): 100 mg via ORAL
  Filled 2012-03-15 (×3): qty 10

## 2012-03-15 NOTE — Evaluation (Addendum)
Clinical/Bedside Swallow Evaluation Patient Details  Name: Carrie Morris MRN: [id number] Date of Birth: 1921/07/02  Today's Date: 03/15/2012 Time: 6387-5643 SLP Time Calculation (min): 40 min  Past Medical History:  Past Medical History  Diagnosis Date  . Gout   . Bilateral swelling of feet   . High blood pressure   . Thyroid disease   . E-coli UTI     History of in 11/ 2012 admission  . Dysrhythmia     a-fib   Past Surgical History: History reviewed. No pertinent past surgical history. HPI:  79 yo female adm to [facility] with afib and rapid ventricular response.  Pt CXR 03/15/12 showed right lung base opacity, could represent atx or developing pna.  Pt also has h/o very large hiatal hernia, thyroid disease and C6-C7 anterior spurring found after imaging 06/2011 that may impact her swallowing in this slp's opinion.  Pt is febrile today with suspected retained pharyngeal secretions that she clears with cough but they recur.   Intake has been poor per son/pt but pt denies change in swallow ability.  RN reports pt with congested cough all day today.  Flu test was negative.   Dg Chest 2 View  03/14/2012  *RADIOLOGY REPORT*  Clinical Data: Cough, shortness of breath.  CHEST - 2 VIEW  Comparison: 11/27/2010  Findings: Large hiatal hernia, stable.  Heart is mildly enlarged. Hyperinflation of the lungs.  Left lower lobe airspace opacity, most likely compressive atelectasis.  No confluent opacity on the right.  Mild fullness of the right hilum felt to be vascular. Deviation of the trachea to the right likely related to tortuosity and ectasia of the aorta.  No visible effusions.  No acute bony abnormality.  IMPRESSION: No acute cardiopulmonary disease.  Mild cardiomegaly.  Large hiatal hernia.  Compressive atelectasis in the left lower lobe.   Original Report Authenticated By: Charlett Nose, M.D.    Dg Chest Port 1 View  03/15/2012  *RADIOLOGY REPORT*  Clinical Data: 79 year old female cough and abnormal  auscultation in the right lung.  PORTABLE CHEST - 1 VIEW  Comparison: 03/14/2012 and earlier.  Findings: Portable semi upright AP view 0822 hours.  Stable cardiac size and mediastinal contours.  Stable retrocardiac hernia.  No pneumothorax or pulmonary edema.  New patchy and streaky right lung base opacity.  No large pleural effusion.  IMPRESSION: New streaky right lung base opacity could reflect atelectasis or developing pneumonia.   Original Report Authenticated By: Erskine Speed, M.D.      Assessment / Plan / Recommendation Clinical Impression  Difficult assessment given level of pt's baseline excessive cough and suspect pharygneal secretion retention.  Son also reports mother with baseline cough at home with frequent expectoration of secretions.  Pt's baseilne cough worsened significantly with po intake- concerning for aspiration.  Anterior cervical spurring at C6-C7 may impact epiglottic deflection.    Given known large hiatal hernia and thyroid disease suspect pt may have esophageal deficits that she typically manages when at her normal state of health.  Son reports mother had an episode where she coughed up and expectorated a large antibiotic pill in Oct 2012.  Son further reports pt does not eat large amounts prior to admission but usually does not have problems swallowing.    Pt reports her swallowing ability to be at baseline and denies dysphagia, nor worsening with copious secretions.   Pt appears to be high aspiration risk currently due to secretions, pulmonary status and suspected dysphagia.   SLP can  not rule out overt aspiration and/or multifactorial dysphagia at bedside.  If pt is aspirating, suspect primary source is esophageal, please consider ordering an esophagram given h/o LARGE hiatal hernia and current pulmonary issue.    SLP to follow to provide tips to mitigate aspiration risk but at this time, it does not appear it can be eliminiated.     Aspiration Risk  Moderate    Diet  Recommendation  (NPO until esophagram can be completed)        Other  Recommendations Recommended Consults: Consider esophageal assessment   Follow Up Recommendations       Frequency and Duration min 1 x/week  1 week   Pertinent Vitals/Pain Febrile, decreased- rhonchi    SLP Swallow Goals Patient will utilize recommended strategies during swallow to increase swallowing safety with: Total assistance   Swallow Study Prior Functional Status   fed self at home    General HPI: 79 yo female adm to [facility] with afib and rapid ventricular response.  Pt CXR 03/15/12 showed right lung base opacity, could represent atx or developing pna.  Pt also has h/o very large hiatal hernia, thyroid disease and C6-C7 anterior spurring found after imaging 06/2011 that may impact her swallowing in this slp's opinion.  Pt is febrile today with suspected retained pharyngeal secretions that she clears with cough but they recur.   Intake has been poor per son/pt but pt denies change in swallow ability.  RN reports pt with congested cough all day today.  Flu test was negative.    Type of Study: Bedside swallow evaluation Previous Swallow Assessment: none Diet Prior to this Study: Regular;Thin liquids Temperature Spikes Noted: Yes Respiratory Status: Supplemental O2 delivered via (comment) History of Recent Intubation: No Behavior/Cognition: Alert;Cooperative;Distractible Oral Cavity - Dentition: Adequate natural dentition Self-Feeding Abilities: Able to feed self Patient Positioning: Upright in bed Baseline Vocal Quality: Clear (voice becomes dysphonic with coughing) Volitional Cough: Strong (productive) Volitional Swallow: Able to elicit    Oral/Motor/Sensory Function Overall Oral Motor/Sensory Function: Appears within functional limits for tasks assessed (no focal cn deficits, sluggish palatal elevation )   Ice Chips     Thin Liquid Thin Liquid: Impaired Presentation: Self Fed;Straw Pharyngeal  Phase  Impairments: Cough - Delayed Other Comments: overt coughing noted with and without po intake    Nectar Thick Nectar Thick Liquid: Impaired Presentation: Self Fed;Straw Pharyngeal Phase Impairments: Cough - Delayed Other Comments: pt coughed and expectorated what appeared to be ensure mixed with secretions    Honey Thick Honey Thick Liquid: Not tested   Puree Puree: Not tested Other Comments: pt declined   Solid   GO    Solid: Not tested Other Comments: pt declined       Donavan Burnet, MS [facility] SLP [phone]

## 2012-03-15 NOTE — Progress Notes (Signed)
CARE MANAGEMENT NOTE 03/15/2012  Patient:  Carrie Morris, Carrie Morris   Account Number:  [id number]  Date Initiated:  03/15/2012  Documentation initiated by:  DAVIS,RHONDA  Subjective/Objective Assessment:   ADMITTED WITH A.FIB WITH RVR     Action/Plan:   LIVES AT HOME WITH FAMILY SUPPORT   Anticipated DC Date:  03/18/2012   Anticipated DC Plan:  HOME/SELF CARE  In-house referral  NA      DC Planning Services  NA      PAC Choice  NA  NA   Choice offered to / List presented to:  NA   DME arranged  NA      DME agency  NA     HH arranged  NA      HH agency  NA   Status of service:  In process, will continue to follow Medicare Important Message given?  NA - LOS <3 / Initial given by admissions (If response is "NO", the following Medicare IM given date fields will be blank) Date Medicare IM given:   Date Additional Medicare IM given:    Discharge Disposition:    Per UR Regulation:  Reviewed for med. necessity/level of care/duration of stay  If discussed at Long Length of Stay Meetings, dates discussed:    Comments:  [id number]/Rhonda Earlene Plater, RN, BSN, CCM:  CHART REVIEWED AND UPDATED.  Next chart review due on [id number]. NO DISCHARGE NEEDS PRESENT AT THIS TIME. CASE MANAGEMENT [phone]

## 2012-03-15 NOTE — Progress Notes (Signed)
[facility] PROGRESS NOTE  Carrie Morris ZOX:[id number] DOB: [birth year] DOA: 03/14/2012  PCP: Hoyle Sauer, MD Cardiologist: [facility]  Brief HPI: 58 year olf female with past medical history of atrial fibrillation on coumadin, hypothyroidism who presented to ED with few day history of complaints of worsening cough , productive of whitish/yellowish sputum, shortness of breath. No reports of chest pain, no palpitations. No fever or chills reported. No abdominal pain, no nausea or vomiting. No lightheadedness or dizziness or loss of consciousness. No reports of blood in stool or urine. No diarrhea or constipation. In ED, evaluation included CXR which did not reveal acute cardiopulmonary process. BMET revealed creatinine elevation of 1.18 (on previous admission was WNL), INR at 2.06. Her EKG revealed she was in A fib with rapid ventricular response and she was started on Cardizem drip in ED.  Past medical history:  Past Medical History  Diagnosis Date  . Gout   . Bilateral swelling of feet   . High blood pressure   . Thyroid disease     Consultants: None  Procedures: None  Antibiotics: IV Ceftriaxone 2/16--> IV Azithromycin 2/16-->  Subjective: Patient continues to have a cough with clear expectoration. Short of breath. No nausea or vomiting. Denies chest pain. Weak.  Objective: Vital Signs  Filed Vitals:   03/15/12 0000 03/15/12 0400 03/15/12 0600 03/15/12 0700  BP: 123/56 99/60 129/48 124/91  Pulse: 81 79 75 72  Temp: 98.8 F (37.1 C) 98.2 F (36.8 C)    TempSrc: Oral Oral    Resp: 32 23 31 29   Height:      Weight:      SpO2: 91% 94% 96% 93%    Intake/Output Summary (Last 24 hours) at 03/15/12 8119 Last data filed at 03/15/12 0200  Gross per 24 hour  Intake   82.5 ml  Output      0 ml  Net   82.5 ml   Filed Weights   03/14/12 2000  Weight: 72.3 kg (159 lb 6.3 oz)    General appearance: alert, cooperative, appears stated age and moderately  obese Head: Normocephalic, without obvious abnormality, atraumatic Back: symmetric, no curvature. ROM normal. No CVA tenderness. Resp: Tachypneic, rhonchi bilaterally right more than left. Few wheezes. Crackles right more than left. Cardio: regular rate and rhythm, S1, S2 normal, no murmur, click, rub or gallop GI: soft, non-tender; bowel sounds normal; no masses,  no organomegaly Extremities: extremities normal, atraumatic, no cyanosis or edema Neurologic: Alert and oriented x 3. No focal deficits. Generalized weakness.  Lab Results:  Basic Metabolic Panel:  Recent Labs Lab 03/14/12 1734 03/14/12 2352 03/15/12 0346  NA 137 137 136  K 3.7 3.8 3.8  CL 100 105 106  CO2 23 21 21   GLUCOSE 106* 111* 112*  BUN 17 15 15   CREATININE 1.18* 1.02 0.99  CALCIUM 9.4 8.2* 8.4  MG  --  1.9  --   PHOS  --  2.9  --    Liver Function Tests:  Recent Labs Lab 03/14/12 2352 03/15/12 0346  AST 18 22  ALT 10 10  ALKPHOS 69 69  BILITOT 0.8 0.9  PROT 6.5 6.4  ALBUMIN 2.9* 3.0*   CBC:  Recent Labs Lab 03/14/12 1734 03/14/12 2352 03/15/12 0346  WBC 8.6 8.2 9.1  NEUTROABS 6.6 6.2  --   HGB 15.6* 12.7 12.3  HCT 44.9 36.9 37.7  MCV 95.5 95.8 96.7  PLT 245 195 204   Cardiac Enzymes:  Recent Labs Lab 03/14/12 1734  TROPONINI <0.30   CBG:  Recent Labs Lab 03/15/12 0728  GLUCAP 112*    Recent Results (from the past 240 hour(s))  MRSA PCR SCREENING     Status: None   Collection Time    03/14/12  9:50 PM      Result Value Range Status   MRSA by PCR NEGATIVE  NEGATIVE Final   Comment:            The GeneXpert MRSA Assay (FDA     approved for NASAL specimens     only), is one component of a     comprehensive MRSA colonization     surveillance program. It is not     intended to diagnose MRSA     infection nor to guide or     monitor treatment for     MRSA infections.      Studies/Results: Dg Chest 2 View  03/14/2012  *RADIOLOGY REPORT*  Clinical Data: Cough,  shortness of breath.  CHEST - 2 VIEW  Comparison: 11/27/2010  Findings: Large hiatal hernia, stable.  Heart is mildly enlarged. Hyperinflation of the lungs.  Left lower lobe airspace opacity, most likely compressive atelectasis.  No confluent opacity on the right.  Mild fullness of the right hilum felt to be vascular. Deviation of the trachea to the right likely related to tortuosity and ectasia of the aorta.  No visible effusions.  No acute bony abnormality.  IMPRESSION: No acute cardiopulmonary disease.  Mild cardiomegaly.  Large hiatal hernia.  Compressive atelectasis in the left lower lobe.   Original Report Authenticated By: Charlett Nose, M.D.     Medications:  Scheduled: . azithromycin  500 mg Intravenous Q24H  . cefTRIAXone (ROCEPHIN)  IV  1 g Intravenous Q24H  . diltiazem  60 mg Oral Q6H  . guaiFENesin  600 mg Oral BID  . levothyroxine  75 mcg Oral QAC breakfast  . sodium chloride  3 mL Intravenous Q12H   Continuous: . sodium chloride 75 mL/hr at 03/15/12 0807  . diltiazem (CARDIZEM) infusion 5 mg/hr (03/15/12 0300)   ZOX:[id number], acetaminophen, HYDROcodone-acetaminophen, levalbuterol, ondansetron (ZOFRAN) IV, ondansetron  Assessment/Plan:  Principal Problem:   Atrial fibrillation with rapid ventricular response Active Problems:   HTN (hypertension)   Hypothyroidism   Acute kidney failure   Shortness of breath   CAP (community acquired pneumonia)    Atrial fibrillation with rapid ventricular response  Currently patient is in sinus rhythm on telemetry. Will transition to oral cardizem. Short acting first and then change to LA tomorrow. Check FT4. TSH is pending as well. ECHO last done in 11/2010 which showed normal EF. No clear indication to repeat as this event was likely precipitated by respiratory issues. Continue warfarin.   Dyspnea/Cough No infiltrate seen on CXR. Will repeat as she could have been dehydrated last night. Continue with antibiotics empirically.  Obtain swallow evaluation. Xopenex PRN. O2.   History of HTN (hypertension)  Continue to monitor.   History of Hypothyroidism  Continue levothyroxine. Follow TSh and FT4.  Mild Acute kidney failure  Resolved. Likely dehydration due to acute illness/infection.   DVT Prophylaxis: On warfarin Code Status: Full Code Family Communication: Pt at bedside  Disposition Plan: Leave in stepdown for now. Mobilize as tolerated.    LOS: 1 day   [facility]  [facility] Pager [phone] 03/15/2012, 8:03 AM  If 8PM-8AM, please contact night-coverage at [email or web address], password [facility]

## 2012-03-15 NOTE — Progress Notes (Signed)
ANTICOAGULATION CONSULT NOTE - Follow Up Consult  Pharmacy Consult for Warfarin Indication: atrial fibrillation  No Known Allergies  Patient Measurements: Height: 5\' 2"  (157.5 cm) Weight: 159 lb 6.3 oz (72.3 kg) IBW/kg (Calculated) : 50.1  Vital Signs: Temp: 101 F (38.3 C) (02/17 1200) Temp src: Oral (02/17 1200) BP: 116/92 mmHg (02/17 1000) Pulse Rate: 84 (02/17 1000)  Labs:  Recent Labs  03/14/12 1734 03/14/12 2352 03/15/12 0346  HGB 15.6* 12.7 12.3  HCT 44.9 36.9 37.7  PLT 245 195 204  APTT 45* 43*  --   LABPROT 22.4* 24.0* 24.4*  INR 2.06* 2.26* 2.32*  CREATININE 1.18* 1.02 0.99  TROPONINI <0.30  --   --     Estimated Creatinine Clearance: 36.6 ml/min (by C-G formula based on Cr of 0.99).   Medications:  Scheduled:  . [COMPLETED] azithromycin  500 mg Intravenous Once  . azithromycin  500 mg Intravenous Q24H  . [COMPLETED] cefTRIAXone (ROCEPHIN)  IV  1 g Intravenous Once  . cefTRIAXone (ROCEPHIN)  IV  1 g Intravenous Q24H  . diltiazem  60 mg Oral Q6H  . guaiFENesin  600 mg Oral BID  . [COMPLETED] levalbuterol  1.25 mg Nebulization Once  . levothyroxine  75 mcg Oral QAC breakfast  . [COMPLETED] sodium chloride  500 mL Intravenous Once  . [COMPLETED] sodium chloride  500 mL Intravenous Once  . sodium chloride  3 mL Intravenous Q12H  . [DISCONTINUED] azithromycin  500 mg Intravenous Q24H  . [DISCONTINUED] cefTRIAXone (ROCEPHIN)  IV  1 g Intravenous Q24H  . [DISCONTINUED] dextromethorphan-guaiFENesin  1 tablet Oral BID  . [DISCONTINUED] zolpidem  5 mg Oral QHS   Infusions:  . sodium chloride 75 mL/hr at 03/15/12 0807  . [EXPIRED] diltiazem (CARDIZEM) infusion 5 mg/hr (03/15/12 0800)  . [DISCONTINUED] sodium chloride Stopped (03/14/12 2054)    Assessment:  89 yof on chronic warfarin for h/o Afib.  INR is therapeutic on admission.  PTA warfarin dose is 2.5mg  daily, except 5mg  on MWF.  Last dose was 2/16.  INR (2.32) remains therapeutic  CBC:  Hgb and  Plt remain wnl and stable.  Bedside swallow exam:  SLP can not rule out overt aspiration and/or multifactorial dysphagia at bedside.  Goal of Therapy:  INR 2-3   Plan:   Warfarin 2.5mg  PO today at 1800 x1  Daily INR   Lynann Beaver PharmD, BCPS Pager [phone] 03/15/2012 3:35 PM

## 2012-03-15 NOTE — Progress Notes (Signed)
PT Cancellation Note  Patient Details Name: Carrie Morris MRN: [id number] DOB: December 01, 1921   Cancelled Treatment:    Reason Eval/Treat Not Completed: Medical issues which prohibited therapyPt reports being tired. SP was with pt . Will check back 2/18.   Rada Hay 03/15/2012, 12:55 PM

## 2012-03-16 DIAGNOSIS — K225 Diverticulum of esophagus, acquired: Secondary | ICD-10-CM | POA: Diagnosis present

## 2012-03-16 DIAGNOSIS — J690 Pneumonitis due to inhalation of food and vomit: Secondary | ICD-10-CM

## 2012-03-16 LAB — BASIC METABOLIC PANEL
BUN: 11 mg/dL (ref 6–23)
CO2: 23 mEq/L (ref 19–32)
Calcium: 8.8 mg/dL (ref 8.4–10.5)
Creatinine, Ser: 0.92 mg/dL (ref 0.50–1.10)
Glucose, Bld: 95 mg/dL (ref 70–99)

## 2012-03-16 LAB — CBC
HCT: 38.9 % (ref 36.0–46.0)
Hemoglobin: 13 g/dL (ref 12.0–15.0)
MCH: 32.6 pg (ref 26.0–34.0)
MCV: 97.5 fL (ref 78.0–100.0)
Platelets: 204 10*3/uL (ref 150–400)
RBC: 3.99 MIL/uL (ref 3.87–5.11)

## 2012-03-16 LAB — URINE CULTURE
Colony Count: NO GROWTH
Culture: NO GROWTH

## 2012-03-16 LAB — PROTIME-INR
INR: 1.85 — ABNORMAL HIGH (ref 0.00–1.49)
Prothrombin Time: 20.7 seconds — ABNORMAL HIGH (ref 11.6–15.2)

## 2012-03-16 MED ORDER — METRONIDAZOLE IN NACL 5-0.79 MG/ML-% IV SOLN
500.0000 mg | Freq: Three times a day (TID) | INTRAVENOUS | Status: DC
  Administered 2012-03-16 – 2012-03-17 (×4): 500 mg via INTRAVENOUS
  Filled 2012-03-16 (×6): qty 100

## 2012-03-16 MED ORDER — WARFARIN SODIUM 5 MG PO TABS
5.0000 mg | ORAL_TABLET | Freq: Once | ORAL | Status: AC
  Administered 2012-03-16: 5 mg via ORAL
  Filled 2012-03-16: qty 1

## 2012-03-16 MED ORDER — SODIUM CHLORIDE 0.9 % IV SOLN
INTRAVENOUS | Status: DC
  Administered 2012-03-17: 10:00:00 via INTRAVENOUS

## 2012-03-16 NOTE — Progress Notes (Addendum)
[facility] PROGRESS NOTE  Carrie Morris WGN:[id number] DOB: August 28, 1921 DOA: 03/14/2012  PCP: Hoyle Sauer, MD Cardiologist: [facility]  Brief HPI: 79 year olf female with past medical history of atrial fibrillation on coumadin, hypothyroidism who presented to ED with few day history of complaints of worsening cough , productive of whitish/yellowish sputum, shortness of breath. No reports of chest pain, no palpitations. No fever or chills reported. No abdominal pain, no nausea or vomiting. No lightheadedness or dizziness or loss of consciousness. No reports of blood in stool or urine. No diarrhea or constipation. In ED, evaluation included CXR which did not reveal acute cardiopulmonary process. BMET revealed creatinine elevation of 1.18 (on previous admission was WNL), INR at 2.06. Her EKG revealed she was in A fib with rapid ventricular response and she was started on Cardizem drip in ED.  Past medical history:  Past Medical History  Diagnosis Date  . Gout   . Bilateral swelling of feet   . High blood pressure   . Thyroid disease   . E-coli UTI     History of in 11/ 2012 admission  . Dysrhythmia     a-fib    Consultants: None  Procedures: None  Antibiotics: IV Ceftriaxone 2/16--> IV Azithromycin 2/16--> IV Flagyl 2/18-->  Subjective: Patient complains of feeling poorly. Denies any chest pain or shortness of breath. Says she has to continually clear her throat. No pain elsewhere.   Objective: Vital Signs  Filed Vitals:   03/15/12 2200 03/16/12 0000 03/16/12 0200 03/16/12 0400  BP: 119/54 122/45 121/57 107/65  Pulse:  79 86 98  Temp:  98.2 F (36.8 C)  97.5 F (36.4 C)  TempSrc:  Oral  Oral  Resp:  20 20 27   Height:      Weight:      SpO2:  98%  97%    Intake/Output Summary (Last 24 hours) at 03/16/12 7846 Last data filed at 03/16/12 0400  Gross per 24 hour  Intake   2635 ml  Output    350 ml  Net   2285 ml   Filed Weights   03/14/12 2000  Weight:  72.3 kg (159 lb 6.3 oz)    General appearance: alert, cooperative, appears stated age and moderately obese Head: Normocephalic, without obvious abnormality, atraumatic Resp: Not as tachypneic today, rhonchi bilaterally right more than left. Few wheezes. Crackles right more than left. Cardio: irregular rate and rhythm, no murmur, click, rub or gallop GI: soft, non-tender; bowel sounds normal; no masses,  no organomegaly Extremities: extremities normal, atraumatic, no cyanosis. Minimal edema Neurologic: Alert and oriented x 3. No focal deficits. Generalized weakness.  Lab Results:  Basic Metabolic Panel:  Recent Labs Lab 03/14/12 1734 03/14/12 2352 03/15/12 0346 03/16/12 0330  NA 137 137 136 137  K 3.7 3.8 3.8 3.7  CL 100 105 106 103  CO2 23 21 21 23   GLUCOSE 106* 111* 112* 95  BUN 17 15 15 11   CREATININE 1.18* 1.02 0.99 0.92  CALCIUM 9.4 8.2* 8.4 8.8  MG  --  1.9  --   --   PHOS  --  2.9  --   --    Liver Function Tests:  Recent Labs Lab 03/14/12 2352 03/15/12 0346  AST 18 22  ALT 10 10  ALKPHOS 69 69  BILITOT 0.8 0.9  PROT 6.5 6.4  ALBUMIN 2.9* 3.0*   CBC:  Recent Labs Lab 03/14/12 1734 03/14/12 2352 03/15/12 0346 03/16/12 0330  WBC 8.6 8.2 9.1  8.0  NEUTROABS 6.6 6.2  --   --   HGB 15.6* 12.7 12.3 13.0  HCT 44.9 36.9 37.7 38.9  MCV 95.5 95.8 96.7 97.5  PLT 245 195 204 204   Cardiac Enzymes:  Recent Labs Lab 03/14/12 1734  TROPONINI <0.30   CBG:  Recent Labs Lab 03/15/12 0728  GLUCAP 112*    Recent Results (from the past 240 hour(s))  URINE CULTURE     Status: None   Collection Time    03/14/12  9:04 PM      Result Value Range Status   Specimen Description URINE, RANDOM   Final   Special Requests NONE   Final   Culture  Setup Time 03/15/2012 02:54   Final   Colony Count NO GROWTH   Final   Culture NO GROWTH   Final   Report Status 03/16/2012 FINAL   Final  MRSA PCR SCREENING     Status: None   Collection Time    03/14/12  9:50 PM       Result Value Range Status   MRSA by PCR NEGATIVE  NEGATIVE Final   Comment:            The GeneXpert MRSA Assay (FDA     approved for NASAL specimens     only), is one component of a     comprehensive MRSA colonization     surveillance program. It is not     intended to diagnose MRSA     infection nor to guide or     monitor treatment for     MRSA infections.  CULTURE, EXPECTORATED SPUTUM-ASSESSMENT     Status: None   Collection Time    03/15/12  9:50 AM      Result Value Range Status   Specimen Description SPUTUM   Final   Special Requests NONE   Final   Sputum evaluation     Final   Value: THIS SPECIMEN IS ACCEPTABLE. RESPIRATORY CULTURE REPORT TO FOLLOW.   Report Status 03/15/2012 FINAL   Final  CULTURE, RESPIRATORY (NON-EXPECTORATED)     Status: None   Collection Time    03/15/12  9:50 AM      Result Value Range Status   Specimen Description SPUTUM   Final   Special Requests NONE   Final   Gram Stain     Final   Value: RARE WBC PRESENT, PREDOMINANTLY MONONUCLEAR     RARE SQUAMOUS EPITHELIAL CELLS PRESENT     RARE GRAM POSITIVE COCCI     IN PAIRS RARE GRAM NEGATIVE RODS   Culture PENDING   Incomplete   Report Status PENDING   Incomplete      Studies/Results: Dg Chest 2 View  03/14/2012  *RADIOLOGY REPORT*  Clinical Data: Cough, shortness of breath.  CHEST - 2 VIEW  Comparison: 11/27/2010  Findings: Large hiatal hernia, stable.  Heart is mildly enlarged. Hyperinflation of the lungs.  Left lower lobe airspace opacity, most likely compressive atelectasis.  No confluent opacity on the right.  Mild fullness of the right hilum felt to be vascular. Deviation of the trachea to the right likely related to tortuosity and ectasia of the aorta.  No visible effusions.  No acute bony abnormality.  IMPRESSION: No acute cardiopulmonary disease.  Mild cardiomegaly.  Large hiatal hernia.  Compressive atelectasis in the left lower lobe.   Original Report Authenticated By: Charlett Nose,  M.D.    Dg Esophagus  03/15/2012  *RADIOLOGY REPORT*  Clinical Data: Dysphagia.  Coughing  ESOPHOGRAM/BARIUM SWALLOW  Technique:  Single contrast examination was performed using thin barium.  Fluoroscopy time:  1.1 minutes.  Comparison:  None.  Findings:  There is a large Zenker's diverticulum.  The patient swallowed several mouthfuls of barium without aspiration.  There is esophageal dysmotility.  There is a hiatal hernia which was not evaluated further detail due to subsequent aspiration.  The patient began coughing and further evaluation of the airway revealed significant aspiration into the trachea and right and left main bronchi.  The patient continued to cough.  Aspiration occurred after the patient finished swallowing the barium.  It is not clear if the aspiration may have occurred due to Zenker's diverticulum.  IMPRESSION: Extensive aspiration was noted approximately 30 seconds  after drinking barium.  Large Zinkers diverticulum which could be related to the aspiration.  Esophageal dysmotility and hiatal hernia.   Original Report Authenticated By: Janeece Riggers, M.D.    Dg Chest Port 1 View  03/15/2012  *RADIOLOGY REPORT*  Clinical Data: 78 year old female cough and abnormal auscultation in the right lung.  PORTABLE CHEST - 1 VIEW  Comparison: 03/14/2012 and earlier.  Findings: Portable semi upright AP view 0822 hours.  Stable cardiac size and mediastinal contours.  Stable retrocardiac hernia.  No pneumothorax or pulmonary edema.  New patchy and streaky right lung base opacity.  No large pleural effusion.  IMPRESSION: New streaky right lung base opacity could reflect atelectasis or developing pneumonia.   Original Report Authenticated By: Erskine Speed, M.D.     Medications:  Scheduled: . azithromycin  500 mg Intravenous Q24H  . cefTRIAXone (ROCEPHIN)  IV  1 g Intravenous Q24H  . diltiazem  60 mg Oral Q6H  . guaiFENesin  600 mg Oral BID  . levothyroxine  75 mcg Oral QAC breakfast  . sodium  chloride  3 mL Intravenous Q12H  . Warfarin - Pharmacist Dosing Inpatient   Does not apply q1800   Continuous: . sodium chloride 75 mL/hr at 03/15/12 1958   OZH:YQMVHQIONGEXB, acetaminophen, guaiFENesin, HYDROcodone-acetaminophen, levalbuterol, ondansetron (ZOFRAN) IV, ondansetron  Assessment/Plan:  Principal Problem:   Dyspnea Active Problems:   HTN (hypertension)   Atrial fibrillation with rapid ventricular response   Hypothyroidism   Acute kidney failure   Shortness of breath   CAP (community acquired pneumonia)    Atrial fibrillation with rapid ventricular response  HR is well controlled. Continue current dose of Cardizem till diet is fully established. Will transition to LA cardizem later today or tomorrow.  TSH and FT4 are normal. INR is subtherapeutic today. ECHO last done in 11/2010 which showed normal EF. No clear indication to repeat as this event was likely precipitated by respiratory issues. Continue warfarin per pharmacy.   Pneumonia: Aspiration vs Community acquired Infiltrate seen on repeat CXR. Was febrile yesterday. Continue Ceftriaxone and Azithromycin. Add flagyl. Xopenex PRN. O2.   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 Annalee Genta with ENT. He mentioned that there are options available to her but he will like to see her in his office to discuss further management. No role for immediate intervention. SLP to suggest as safe a diet as possible.   History of HTN (hypertension)  Continue to monitor.   History of Hypothyroidism  Continue levothyroxine. TSH and FT4 normal.  Mild Acute kidney failure  Resolved. Likely dehydration due to acute illness/infection.   DVT Prophylaxis: On warfarin Code Status: Full Code Family Communication: Discussed with her son yesterday at bedside. Discussed with patient today. Disposition Plan: [facility]  for transfer to Tele. Mobilize as tolerated.    LOS: 2 days   [facility]  [facility] Pager  [phone] 03/16/2012, 7:24 AM  If 8PM-8AM, please contact night-coverage at [email or web address], password [facility]

## 2012-03-16 NOTE — Evaluation (Signed)
Physical Therapy Evaluation Patient Details Name: Carrie Morris MRN: [id number] DOB: 1921-07-14 Today's Date: 03/16/2012 Time: 6578-4696 PT Time Calculation (min): 33 min  PT Assessment / Plan / Recommendation Clinical Impression  Pt. admitted on 2/16 with several day history of productive cough. Pt. found to be in afib/RVR in ER. Pt has congestion present. Pt. lived with son but was independent. Son works. Pt. will benefit from PT while in acute care. Recommend OT consult. Pt. may require SNF if does not have 24/7 assistance.    PT Assessment  Patient needs continued PT services    Follow Up Recommendations  SNF    Does the patient have the potential to tolerate intense rehabilitation      Barriers to Discharge Decreased caregiver support      Equipment Recommendations  Rolling walker with 5" wheels    Recommendations for Other Services OT consult   Frequency Min 3X/week    Precautions / Restrictions Precautions Precautions: Fall Precaution Comments: monitor sats   Pertinent Vitals/Pain HR into 120's during mobility, sats >92% on 2 l.      Mobility  Bed Mobility Bed Mobility: Supine to Sit;Sitting - Scoot to Edge of Bed Supine to Sit: With rails;HOB elevated;3: Mod assist Sitting - Scoot to Edge of Bed: 4: Min assist Details for Bed Mobility Assistance: cues for roll to side and push up Transfers Transfers: Sit to Stand;Stand to [employer] Transfers Sit to Stand: 4: Min assist;With upper extremity assist;From bed Stand to Sit: To chair/3-in-1;With armrests;With upper extremity assist;4: Min assist Stand Pivot Transfers: 4: Min assist Details for Transfer Assistance: cues for use of UE to reach to [employer],  Ambulation/Gait Ambulation/Gait Assistance: 4: Min assist Ambulation Distance (Feet): 5 Feet Assistive device: 1 person hand held assist Ambulation/Gait Assistance Details: from [facility] to recliner, pt held onto armrest also Gait Pattern: Step-to pattern;Trunk  flexed    Exercises     PT Diagnosis: Difficulty walking;Generalized weakness  PT Problem List: Decreased strength;Decreased activity tolerance;Decreased mobility;Decreased knowledge of use of DME;Decreased safety awareness;Decreased knowledge of precautions;Cardiopulmonary status limiting activity PT Treatment Interventions: DME instruction;Gait training;Functional mobility training;Therapeutic activities;Therapeutic exercise;Patient/family education   PT Goals Acute Rehab PT Goals PT Goal Formulation: With patient/family Time For Goal Achievement: 03/30/12 Potential to Achieve Goals: Good Pt will go Supine/Side to Sit: Independently PT Goal: Supine/Side to Sit - Progress: Goal set today Pt will go Sit to Supine/Side: Independently PT Goal: Sit to Supine/Side - Progress: Goal set today Pt will go Sit to Stand: with supervision PT Goal: Sit to Stand - Progress: Goal set today Pt will go Stand to Sit: with supervision PT Goal: Stand to Sit - Progress: Goal set today Pt will Ambulate: 51 - 150 feet;with supervision;with rolling walker PT Goal: Ambulate - Progress: Goal set today Pt will Perform Home Exercise Program: with supervision, verbal cues required/provided PT Goal: Perform Home Exercise Program - Progress: Goal set today  Visit Information  Last PT Received On: 03/16/12 Assistance Needed: +1 (+2 if walk)    Subjective Data  Subjective: I will et up if you want me to. Patient Stated Goal: to return home   Prior Functioning  Home Living Lives With: Son Available Help at Discharge: Family;Available PRN/intermittently (son workd 8-5:00) Type of Home: House Home Access: Stairs to enter Home Layout: One level Bathroom Shower/Tub: Network engineer: None Prior Function Level of Independence: Independent Driving: Yes Communication Communication: No difficulties    Cognition  Cognition Overall  Cognitive Status: Appears  within functional limits for tasks assessed/performed Arousal/Alertness: Awake/alert Orientation Level: Appears intact for tasks assessed Behavior During Session: [facility] for tasks performed    Extremity/Trunk Assessment Right Upper Extremity Assessment RUE ROM/Strength/Tone: [facility] for tasks assessed Left Upper Extremity Assessment LUE ROM/Strength/Tone: WFL for tasks assessed Right Lower Extremity Assessment RLE ROM/Strength/Tone: WFL for tasks assessed RLE Sensation: WFL - Light Touch Left Lower Extremity Assessment LLE ROM/Strength/Tone: WFL for tasks assessed LLE Sensation: WFL - Light Touch   Balance Balance Balance Assessed: Yes Static Sitting Balance Static Sitting - Balance Support: Bilateral upper extremity supported Static Sitting - Level of Assistance: 5: Stand by assistance  End of Session PT - End of Session Activity Tolerance: Patient limited by fatigue Patient left: in chair;with call bell/phone within reach;with family/visitor present  GP     Devin Going 03/16/2012, 1:45 PM [phone]

## 2012-03-16 NOTE — Progress Notes (Signed)
Speech Language Pathology Dysphagia Treatment Patient Details Name: Carrie Morris MRN: [id number] DOB: [birth year] Today's Date: 03/16/2012 Time: 6213-0865 SLP Time Calculation (min): 28 min  Assessment / Plan / Recommendation Clinical Impression  Pt seen today to educate re: findings of barium swallow (Zenker's diverticulum), dysmotility, hiatal hernia and aspiration occuring after pt swallowed.    Per Dr Chancy Milroy notes, pt is to see Dr Annalee Genta (ENT) as outpt in a few weeks when medically stable to determine possible treatment methods.  Therefore goal is to mitigate dysphagia until pt can see Dr Annalee Genta for potential tx of Zenker's as an outpt.    P educated pt and family to chronicity of aspiration and tips provided are to mitigate aspiration - not prevent it.  Today pt's pulmonary status appears much improved- decreased overt congestion.    Pt observed consuming water and applesauce- delay cough with water noted - likely from aspiration.  Advised pt and family that aspiration of solids is more caustic than liquids and water is best tolerated/absorbed by lungs when aspirated.  Suspect pt's dysphagia has been chronic and she has managed until currently but her functional reserve may be decreased at this time.    Do not recommend an MBS at this time if pt "tolerates" po diet per monitoring of lung sounds, temps, amount of intake, etc.     Advised pt to consume small amounts of po throughout the day and take rest breaks if overtly coughing with meals.  Pt and family verbalized understanding to information provided and pt taught back information.    Per family friend who is a Engineer, civil (consulting), pt has been coughing more with meals and has been consuming less recently- indicating exacerbation of baseline dysphagia.  SLP questions pt's coughing and expectoration even PTA is retained pharyngeal and/or esophageal secretions from known deficits?      SLP to follow for education/tolerance, pt and family agree.   Thanks.     Diet Recommendation  Continue with Current Diet: Dysphagia 3 (mechanical soft);Thin liquid (ground meats/extra gravies/sauces) with accepted aspiration risk   SLP Plan Continue with current plan of care   Pertinent Vitals/Pain Afebrile, decreased   Swallowing Goals  SLP Swallowing Goals Patient will utilize recommended strategies during swallow to increase swallowing safety with: Minimal assistance  General Temperature Spikes Noted: No (was febrile yesterday) Respiratory Status: Supplemental O2 delivered via (comment) Behavior/Cognition: Alert;Cooperative;Pleasant mood Oral Cavity - Dentition: Adequate natural dentition Patient Positioning: Upright in chair  Oral Cavity - Oral Hygiene     Dysphagia Treatment Treatment focused on: Patient/family/caregiver education;Utilization of compensatory strategies Family/Caregiver Educated: pt, sons x2 Carrie Morris and son from [place] Treatment Methods/Modalities: Skilled observation (review of barium swallow study) Patient observed directly with PO's: Yes Type of PO's observed: Dysphagia 1 (puree);Thin liquids Feeding: Able to feed self Liquids provided via: Cup Pharyngeal Phase Signs & Symptoms: Delayed cough Type of cueing: Verbal Amount of cueing: Moderate (to take small single sips/bites)   GO     Donavan Burnet, MS [facility] SLP [phone]

## 2012-03-16 NOTE — Progress Notes (Signed)
ANTICOAGULATION CONSULT NOTE - Follow Up Consult  Pharmacy Consult for Warfarin Indication: atrial fibrillation  No Known Allergies  Patient Measurements: Height: 5\' 2"  (157.5 cm) Weight: 159 lb 6.3 oz (72.3 kg) IBW/kg (Calculated) : 50.1  Vital Signs: Temp: 98.4 F (36.9 C) (02/18 0800) Temp src: Oral (02/18 0800) BP: 107/65 mmHg (02/18 0400) Pulse Rate: 98 (02/18 0400)  Labs:  Recent Labs  03/14/12 1734 03/14/12 2352 03/15/12 0346 03/16/12 0330  HGB 15.6* 12.7 12.3 13.0  HCT 44.9 36.9 37.7 38.9  PLT 245 195 204 204  APTT 45* 43*  --   --   LABPROT 22.4* 24.0* 24.4* 20.7*  INR 2.06* 2.26* 2.32* 1.85*  CREATININE 1.18* 1.02 0.99 0.92  TROPONINI <0.30  --   --   --     Estimated Creatinine Clearance: 39.4 ml/min (by C-G formula based on Cr of 0.92).   Medications:  Scheduled:  . azithromycin  500 mg Intravenous Q24H  . cefTRIAXone (ROCEPHIN)  IV  1 g Intravenous Q24H  . diltiazem  60 mg Oral Q6H  . guaiFENesin  600 mg Oral BID  . levothyroxine  75 mcg Oral QAC breakfast  . metronidazole  500 mg Intravenous Q8H  . sodium chloride  3 mL Intravenous Q12H  . [COMPLETED] warfarin  2.5 mg Oral ONCE-1800  . Warfarin - Pharmacist Dosing Inpatient   Does not apply q1800   Infusions:  . sodium chloride 75 mL/hr at 03/15/12 1958    Assessment:  89 yof on chronic warfarin for h/o Afib.  INR is therapeutic on admission.  PTA warfarin dose is 2.5mg  daily, except 5mg  on MWF.  INR (1.85) decreased to subtherapeutic  CBC:  Hgb and Plt remain wnl and stable.  Noted potential drug interactions with metronidazole and azithromycin (may prolong INR)  Goal of Therapy:  INR 2-3   Plan:   Warfarin 5mg  PO today at 1800 x1  Daily INR, CBC   Lynann Beaver PharmD, BCPS Pager [phone] 03/16/2012 10:57 AM

## 2012-03-17 LAB — BASIC METABOLIC PANEL
BUN: 12 mg/dL (ref 6–23)
Chloride: 104 mEq/L (ref 96–112)
Glucose, Bld: 93 mg/dL (ref 70–99)
Potassium: 3.5 mEq/L (ref 3.5–5.1)
Sodium: 137 mEq/L (ref 135–145)

## 2012-03-17 LAB — CBC
HCT: 35.8 % — ABNORMAL LOW (ref 36.0–46.0)
Hemoglobin: 12 g/dL (ref 12.0–15.0)
RBC: 3.73 MIL/uL — ABNORMAL LOW (ref 3.87–5.11)
WBC: 7.4 10*3/uL (ref 4.0–10.5)

## 2012-03-17 LAB — CULTURE, RESPIRATORY W GRAM STAIN

## 2012-03-17 LAB — PROTIME-INR
INR: 2.38 — ABNORMAL HIGH (ref 0.00–1.49)
Prothrombin Time: 24.9 seconds — ABNORMAL HIGH (ref 11.6–15.2)

## 2012-03-17 MED ORDER — AZITHROMYCIN 500 MG PO TABS
500.0000 mg | ORAL_TABLET | Freq: Every day | ORAL | Status: DC
  Filled 2012-03-17: qty 1

## 2012-03-17 MED ORDER — LEVALBUTEROL TARTRATE 45 MCG/ACT IN AERO: 1.0000 | INHALATION_SPRAY | RESPIRATORY_TRACT | Status: DC | PRN

## 2012-03-17 MED ORDER — MOXIFLOXACIN HCL 400 MG PO TABS: 400.0000 mg | ORAL_TABLET | Freq: Every day | ORAL | Status: DC

## 2012-03-17 MED ORDER — DILTIAZEM HCL ER COATED BEADS 240 MG PO CP24: 240.0000 mg | ORAL_CAPSULE | Freq: Every day | ORAL | Status: DC

## 2012-03-17 MED ORDER — WARFARIN SODIUM 2.5 MG PO TABS
2.5000 mg | ORAL_TABLET | Freq: Once | ORAL | Status: DC
Start: 2012-03-17 — End: 2012-03-17
  Filled 2012-03-17: qty 1

## 2012-03-17 MED ORDER — GUAIFENESIN ER 600 MG PO TB12: 600.0000 mg | ORAL_TABLET | Freq: Two times a day (BID) | ORAL | Status: DC

## 2012-03-17 MED ORDER — HYDROCODONE-ACETAMINOPHEN 5-325 MG PO TABS: 1.0000 | ORAL_TABLET | ORAL | Status: DC | PRN

## 2012-03-17 NOTE — Discharge Summary (Signed)
Physician Discharge Summary  Carrie Morris ZOX:[id number] DOB: 06-11-1921 DOA: 03/14/2012  PCP: Hoyle Sauer, MD  Admit date: 03/14/2012 Discharge date: 03/17/2012  Time spent: >30 minutes  Recommendations for Outpatient Follow-up:      Follow-up Information   Schedule an appointment as soon as possible for a visit with SHOEMAKER, DAVID, MD. (to discuss the zenkers diverticulum)    Contact information:   [address], [address] [place] [address] [phone]       Follow up with Hoyle Sauer, MD. (in 1week, call for appt upon discharge)    Contact information:   [address] [facility] [place] [address] [phone]       Please follow up. (Cardiologidt with [employer] ad vascular- [phone], call for appointment upon discharge)      Discharge Diagnoses:  Principal Problem:   Aspiration pneumonia Active Problems:   HTN (hypertension)   Atrial fibrillation with rapid ventricular response   Hypothyroidism   Acute kidney failure   Shortness of breath   CAP (community acquired pneumonia)   Dyspnea   Zenker diverticulum   Discharge Condition:improve  Diet recommendation: Heart Healthy, low sodium  Filed Weights   03/14/12 2000 03/17/12 0556  Weight: 72.3 kg (159 lb 6.3 oz) 77.1 kg (169 lb 15.6 oz)    History of present illness:  79 year olf female with past medical history of atrial fibrillation on coumadin, hypothyroidism who presented to ED with few day history of complaints of worsening cough , productive of whitish/yellowish sputum, shortness of breath. No reports of chest pain, no palpitations. No fever or chills reported. No abdominal pain, no nausea or vomiting. No lightheadedness or dizziness or loss of consciousness. No reports of blood in stool or urine. No diarrhea or constipation.  In ED, evaluation included CXR which did not reveal acute cardiopulmonary process. BMET  revealed creatinine elevation of 1.18 (on previous admission was WNL), INR at 2.06. Her EKG revealed she is in A fib with rapid ventricular response and she was started on Cardizem drip in ED.   Hospital Course:  Atrial fibrillation with rapid ventricular response  As discussed above, patient was in A. fib with RVR on admission in the ED and was started on IV Cardizem. A troponin was done and came back negative. Her rate control improved on the Cardizem drip and she was changed to short acting Cardizem at home dose and her HR remained well controlled.TSH and FT4 are normal. INR is therapeutic today at 2.38. ECHO last done in 11/2010 which showed normal EF and was not repeated in the hospital. She is to continue her Coumadin upon discharge and have a PT/INR drawn in a.m. for home health RN and call to her PCP. Given that she's being discharged on antibiotics/Avelox for pneumonia I recommend close monitoring of her PT/INR per PCP Pneumonia: Aspiration vs Community acquired  Infiltrate seen on repeat CXR. On admission she was placed on empiric antibiotics with Rocephin and Zithromax. On followup she was febrile and with chest x-ray revealing a right lower lobe infiltrate antibiotic coverage was broadened to include anaerobic coverage. She has remained afebrile, with no leukocytosis and is clinically improved. She is oxygenating well on room air. She'll be discharged on oral antibiotics and that she is to followup with her PCP outpatient. Aspiration due to Zenkers Diverticulum  Discussed with Dr. Annalee Genta with ENT. He mentioned that there are options available to her but he  will like to see her in his office to discuss further management. No role for immediate intervention. SLP to suggest as safe a diet as possible. She is to follow up outpatient with Dr. Annalee Genta. History of HTN (hypertension)  Continue to monitor.  History of Hypothyroidism  Continue levothyroxine. TSH and FT4 normal.  Mild Acute  kidney failure  Resolved. Likely dehydration due to acute illness/infection.    Procedures: none  Consultations:  none  Discharge Exam: Filed Vitals:   03/16/12 1200 03/16/12 1516 03/16/12 1648 03/17/12 0556  BP:  121/87 127/80 112/56  Pulse:  85 93 87  Temp: 98.9 F (37.2 C)  97.6 F (36.4 C) 97.8 F (36.6 C)  TempSrc: Oral  Oral Oral  Resp:  27 22 19   Height:      Weight:    77.1 kg (169 lb 15.6 oz)  SpO2:  96% 95% 99%   General appearance: alert, cooperative, appears stated age and moderately obese  Head: Normocephalic, without obvious abnormality, atraumatic  Resp: scattered rhonchi bil, nowheezes. No Crackles  Cardio: irregular rate and rhythm, no murmur, click, rub or gallop  GI: soft, non-tender; bowel sounds normal; no masses, no organomegaly  Extremities: extremities normal, atraumatic, no cyanosis. Minimal edema  Neurologic: Alert and oriented x 3. No focal deficits. Generalized weakness  Discharge Instructions  Discharge Orders   Future Orders Complete By Expires     Diet - low sodium heart healthy  As directed     Increase activity slowly  As directed         Medication List    TAKE these medications       diltiazem 240 MG 24 hr capsule  Commonly known as:  CARDIZEM CD  Take 1 capsule (240 mg total) by mouth daily.     guaiFENesin 600 MG 12 hr tablet  Commonly known as:  MUCINEX  Take 1 tablet (600 mg total) by mouth 2 (two) times daily.     HYDROcodone-acetaminophen 5-325 MG per tablet  Commonly known as:  NORCO/VICODIN  Take 1-2 tablets by mouth every 4 (four) hours as needed.     levalbuterol 45 MCG/ACT inhaler  Commonly known as:  XOPENEX HFA  Inhale 1-2 puffs into the lungs every 4 (four) hours as needed for wheezing.     levothyroxine 75 MCG tablet  Commonly known as:  SYNTHROID, LEVOTHROID  Take 75 mcg by mouth daily.     moxifloxacin 400 MG tablet  Commonly known as:  AVELOX  Take 1 tablet (400 mg total) by mouth daily.      warfarin 5 MG tablet  Commonly known as:  COUMADIN  Take 2.5-5 mg by mouth See admin instructions. Monday, wed, and Friday she takes a full tablet (5mg ) ,tuesday Thursday Saturday and Sunday she takes 1/2 tablet (2.5mg )           Follow-up Information   Schedule an appointment as soon as possible for a visit with SHOEMAKER, DAVID, MD. (to discuss the zenkers diverticulum)    Contact information:   [address] [place] [address] [phone]       Follow up with Hoyle Sauer, MD. (in 1week, call for appt upon discharge)    Contact information:   [address] [facility] [place] [address] [phone]       Please follow up. (Cardiologidt with [facility] [facility] ad vascular- [phone], call for appointment upon discharge)  The results of significant diagnostics from this hospitalization (including imaging, microbiology, ancillary and laboratory) are listed below for reference.    Significant Diagnostic Studies: Dg Chest 2 View  03/14/2012  *RADIOLOGY REPORT*  Clinical Data: Cough, shortness of breath.  CHEST - 2 VIEW  Comparison: 11/27/2010  Findings: Large hiatal hernia, stable.  Heart is mildly enlarged. Hyperinflation of the lungs.  Left lower lobe airspace opacity, most likely compressive atelectasis.  No confluent opacity on the right.  Mild fullness of the right hilum felt to be vascular. Deviation of the trachea to the right likely related to tortuosity and ectasia of the aorta.  No visible effusions.  No acute bony abnormality.  IMPRESSION: No acute cardiopulmonary disease.  Mild cardiomegaly.  Large hiatal hernia.  Compressive atelectasis in the left lower lobe.   Original Report Authenticated By: Charlett Nose, M.D.    Dg Esophagus  03/15/2012  *RADIOLOGY REPORT*  Clinical Data: Dysphagia.  Coughing  ESOPHOGRAM/BARIUM SWALLOW  Technique:  Single contrast examination was performed using  thin barium.  Fluoroscopy time:  1.1 minutes.  Comparison:  None.  Findings:  There is a large Zenker's diverticulum.  The patient swallowed several mouthfuls of barium without aspiration.  There is esophageal dysmotility.  There is a hiatal hernia which was not evaluated further detail due to subsequent aspiration.  The patient began coughing and further evaluation of the airway revealed significant aspiration into the trachea and right and left main bronchi.  The patient continued to cough.  Aspiration occurred after the patient finished swallowing the barium.  It is not clear if the aspiration may have occurred due to Zenker's diverticulum.  IMPRESSION: Extensive aspiration was noted approximately 30 seconds  after drinking barium.  Large Zinkers diverticulum which could be related to the aspiration.  Esophageal dysmotility and hiatal hernia.   Original Report Authenticated By: Janeece Riggers, M.D.    Dg Chest Port 1 View  03/15/2012  *RADIOLOGY REPORT*  Clinical Data: 79 year old female cough and abnormal auscultation in the right lung.  PORTABLE CHEST - 1 VIEW  Comparison: 03/14/2012 and earlier.  Findings: Portable semi upright AP view 0822 hours.  Stable cardiac size and mediastinal contours.  Stable retrocardiac hernia.  No pneumothorax or pulmonary edema.  New patchy and streaky right lung base opacity.  No large pleural effusion.  IMPRESSION: New streaky right lung base opacity could reflect atelectasis or developing pneumonia.   Original Report Authenticated By: Erskine Speed, M.D.     Microbiology: Recent Results (from the past 240 hour(s))  CULTURE, BLOOD (ROUTINE X 2)     Status: None   Collection Time    03/14/12  8:00 PM      Result Value Range Status   Specimen Description BLOOD LEFT HAND   Final   Special Requests NONE BOTTLES DRAWN AEROBIC AND ANAEROBIC   Final   Culture  Setup Time 03/15/2012 01:23   Final   Culture     Final   Value:        BLOOD CULTURE RECEIVED NO GROWTH TO DATE  CULTURE WILL BE HELD FOR 5 DAYS BEFORE ISSUING A FINAL NEGATIVE REPORT   Report Status PENDING   Incomplete  CULTURE, BLOOD (ROUTINE X 2)     Status: None   Collection Time    03/14/12  8:05 PM      Result Value Range Status   Specimen Description BLOOD LEFTSIDE OF LEFT HAND   Final   Special Requests NONE BOTTLES DRAWN AEROBIC  ONLY 2.5CC   Final   Culture  Setup Time 03/15/2012 01:23   Final   Culture     Final   Value:        BLOOD CULTURE RECEIVED NO GROWTH TO DATE CULTURE WILL BE HELD FOR 5 DAYS BEFORE ISSUING A FINAL NEGATIVE REPORT   Report Status PENDING   Incomplete  URINE CULTURE     Status: None   Collection Time    03/14/12  9:04 PM      Result Value Range Status   Specimen Description URINE, RANDOM   Final   Special Requests NONE   Final   Culture  Setup Time 03/15/2012 02:54   Final   Colony Count NO GROWTH   Final   Culture NO GROWTH   Final   Report Status 03/16/2012 FINAL   Final  MRSA PCR SCREENING     Status: None   Collection Time    03/14/12  9:50 PM      Result Value Range Status   MRSA by PCR NEGATIVE  NEGATIVE Final   Comment:            The GeneXpert MRSA Assay (FDA     approved for NASAL specimens     only), is one component of a     comprehensive MRSA colonization     surveillance program. It is not     intended to diagnose MRSA     infection nor to guide or     monitor treatment for     MRSA infections.  CULTURE, EXPECTORATED SPUTUM-ASSESSMENT     Status: None   Collection Time    03/15/12  9:50 AM      Result Value Range Status   Specimen Description SPUTUM   Final   Special Requests NONE   Final   Sputum evaluation     Final   Value: THIS SPECIMEN IS ACCEPTABLE. RESPIRATORY CULTURE REPORT TO FOLLOW.   Report Status 03/15/2012 FINAL   Final  CULTURE, RESPIRATORY (NON-EXPECTORATED)     Status: None   Collection Time    03/15/12  9:50 AM      Result Value Range Status   Specimen Description SPUTUM   Final   Special Requests NONE   Final    Gram Stain     Final   Value: RARE WBC PRESENT, PREDOMINANTLY MONONUCLEAR     RARE SQUAMOUS EPITHELIAL CELLS PRESENT     RARE GRAM POSITIVE COCCI     IN PAIRS RARE GRAM NEGATIVE RODS   Culture NORMAL OROPHARYNGEAL FLORA   Final   Report Status 03/17/2012 FINAL   Final     Labs: Basic Metabolic Panel:  Recent Labs Lab 03/14/12 1734 03/14/12 2352 03/15/12 0346 03/16/12 0330 03/17/12 0510  NA 137 137 136 137 137  K 3.7 3.8 3.8 3.7 3.5  CL 100 105 106 103 104  CO2 23 21 21 23 23   GLUCOSE 106* 111* 112* 95 93  BUN 17 15 15 11 12   CREATININE 1.18* 1.02 0.99 0.92 0.84  CALCIUM 9.4 8.2* 8.4 8.8 8.4  MG  --  1.9  --   --   --   PHOS  --  2.9  --   --   --    Liver Function Tests:  Recent Labs Lab 03/14/12 2352 03/15/12 0346  AST 18 22  ALT 10 10  ALKPHOS 69 69  BILITOT 0.8 0.9  PROT 6.5 6.4  ALBUMIN 2.9* 3.0*   No  results found for this basename: LIPASE, AMYLASE,  in the last 168 hours No results found for this basename: AMMONIA,  in the last 168 hours CBC:  Recent Labs Lab 03/14/12 1734 03/14/12 2352 03/15/12 0346 03/16/12 0330 03/17/12 0510  WBC 8.6 8.2 9.1 8.0 7.4  NEUTROABS 6.6 6.2  --   --   --   HGB 15.6* 12.7 12.3 13.0 12.0  HCT 44.9 36.9 37.7 38.9 35.8*  MCV 95.5 95.8 96.7 97.5 96.0  PLT 245 195 204 204 213   Cardiac Enzymes:  Recent Labs Lab 03/14/12 1734  TROPONINI <0.30   BNP: BNP (last 3 results) No results found for this basename: PROBNP,  in the last 8760 hours CBG:  Recent Labs Lab 03/15/12 0728 03/16/12 0738 03/17/12 0752  GLUCAP 112* 88 98       Signed:  Terrel Manalo C  [facility] 03/17/2012, 2:09 PM

## 2012-03-17 NOTE — Progress Notes (Signed)
Talked to patient and son about HHC choices, pt/ son chose [facility] for home health care needs. Venia Minks RN with [facility] called for arrangements; B Shelba Flake

## 2012-03-17 NOTE — Progress Notes (Signed)
Patients Son, Verna Desrocher is declining SNF recommendation and would like to take patient home under his care.  He will take time off of work to care for patient.

## 2012-03-17 NOTE — Evaluation (Signed)
Occupational Therapy Evaluation Patient Details Name: Carrie Morris MRN: [id number] DOB: 02/22/1921 Today's Date: 03/17/2012 Time: 4098-1191 OT Time Calculation (min): 25 min  OT Assessment / Plan / Recommendation Clinical Impression  Pt admitted with aspiration pna. Pt with limited endurance, feel st snf may be beneficial. Skilled OT indicated to maximize independence with BADLs to supervision level in prep for d/c to next venue.    OT Assessment  Patient needs continued OT Services    Follow Up Recommendations  SNF;Supervision/Assistance - 24 hour    Barriers to Discharge      Equipment Recommendations  None recommended by OT    Recommendations for Other Services    Frequency  Min 2X/week    Precautions / Restrictions Precautions Precautions: Fall   Pertinent Vitals/Pain Pt denied pain    ADL  Grooming: Min guard Where Assessed - Grooming: Supported standing Upper Body Bathing: Set up Where Assessed - Upper Body Bathing: Unsupported sitting Lower Body Bathing: Minimal assistance Where Assessed - Lower Body Bathing: Supported sit to stand Upper Body Dressing: Set up Where Assessed - Upper Body Dressing: Unsupported sitting Lower Body Dressing: Maximal assistance Where Assessed - Lower Body Dressing: Supported sit to Pharmacist, hospital: Minimal assistance Toilet Transfer Method: Sit to Barista: Comfort height toilet;Grab bars Toileting - Clothing Manipulation and Hygiene: Minimal assistance Where Assessed - Engineer, mining and Hygiene: Sit to stand from 3-in-1 or toilet Equipment Used: Rolling walker Transfers/Ambulation Related to ADLs: Pt ambulated to the bathroom with min A and RW. ADL Comments: Pt required max A to thread LEs into depends and to bathe feet and don/doff socks. 2/4 DOE. Sats remained in mid 90s throughout session on RA.    OT Diagnosis: Generalized weakness  OT Problem List: Decreased activity  tolerance;Decreased safety awareness;Decreased knowledge of use of DME or AE OT Treatment Interventions: Self-care/ADL training;Therapeutic activities;DME and/or AE instruction;Patient/family education   OT Goals Acute Rehab OT Goals OT Goal Formulation: With patient Time For Goal Achievement: 03/31/12 Potential to Achieve Goals: Good ADL Goals Pt Will Perform Grooming: with supervision;Standing at sink ADL Goal: Grooming - Progress: Goal set today Pt Will Perform Lower Body Bathing: with supervision;Sit to stand from chair;Sit to stand from bed ADL Goal: Lower Body Bathing - Progress: Goal set today Pt Will Perform Lower Body Dressing: with supervision;Sit to stand from chair;Sit to stand from bed ADL Goal: Lower Body Dressing - Progress: Goal set today Pt Will Transfer to Toilet: with supervision;Ambulation;with DME ADL Goal: Toilet Transfer - Progress: Goal set today Pt Will Perform Toileting - Clothing Manipulation: with supervision;Sitting on 3-in-1 or toilet;Standing ADL Goal: Toileting - Clothing Manipulation - Progress: Goal set today Pt Will Perform Toileting - Hygiene: with supervision;Sit to stand from 3-in-1/toilet ADL Goal: Toileting - Hygiene - Progress: Goal set today Additional ADL Goal #1: Pt will initiate prn RBs during ADL activity without prompting. ADL Goal: Additional Goal #1 - Progress: Goal set today  Visit Information  Last OT Received On: 03/17/12 Assistance Needed: +1    Subjective Data  Subjective: I don't know why they're making me wear this oxygen. Patient Stated Goal: Not asked.   Prior Functioning     Home Living Lives With: Son Available Help at Discharge: Family;Available PRN/intermittently Type of Home: House Home Access: Level entry Home Layout: One level Bathroom Shower/Tub: Engineer, manufacturing systems: Standard Home Adaptive Equipment: Straight cane Prior Function Level of Independence: Needs assistance Needs Assistance: Meal  Prep;Light Housekeeping Meal Prep:  Total Light Housekeeping: Total Driving: Yes Vocation: Retired Musician: No difficulties         Vision/Perception     Copywriter, advertising Overall Cognitive Status: Appears within functional limits for tasks assessed/performed Arousal/Alertness: Awake/alert Orientation Level: Appears intact for tasks assessed Behavior During Session: [facility] for tasks performed    Extremity/Trunk Assessment Right Upper Extremity Assessment RUE ROM/Strength/Tone: [facility] for tasks assessed Left Upper Extremity Assessment LUE ROM/Strength/Tone: WFL for tasks assessed     Mobility Transfers Sit to Stand: 4: Min assist;With upper extremity assist;From chair/3-in-1;From toilet Stand to Sit: 4: Min assist;With upper extremity assist;With armrests;To chair/3-in-1;To toilet Details for Transfer Assistance: min cues for hand placement and safety.     Exercise     Balance Balance Balance Assessed: Yes Static Standing Balance Static Standing - Balance Support: Bilateral upper extremity supported;During functional activity Static Standing - Level of Assistance: 4: Min assist   End of Session OT - End of Session Activity Tolerance: Patient tolerated treatment well Patient left: in chair;with call bell/phone within reach  GO     Sheala Dosh A  OTR/L [phone]  03/17/2012, 11:16 AM

## 2012-03-17 NOTE — Progress Notes (Signed)
PHARMACIST - PHYSICIAN COMMUNICATION DR:  Suanne Marker CONCERNING: Antibiotic IV to Oral Route Change Policy  RECOMMENDATION: This patient is receiving Azithromycin by the intravenous route, national supply currently limited.  Based on criteria approved by the Pharmacy and Therapeutics Committee, the antibiotic(s) is/are being converted to the equivalent oral dose form(s).   DESCRIPTION: These criteria include:  Patient being treated for a respiratory tract infection, urinary tract infection, or cellulitis  The patient is not neutropenic and does not exhibit a GI malabsorption state  The patient is eating (either orally or via tube) and/or has been taking other orally administered medications for a least 24 hours  The patient is improving clinically and has a Tmax < 100.5  SLP cleared patient for crushed tablets on 2/18  If you have questions about this conversion, please contact the Pharmacy Department  []   [phone] )  [facility] []   [phone] )  [facility]  []   [phone] )  [facility] [x]   [phone] )  [facility]   Loralee Pacas, PharmD, BCPS 03/17/2012 8:22 AM

## 2012-03-17 NOTE — Progress Notes (Signed)
Speech Language Pathology Dysphagia Treatment Patient Details Name: Carrie Morris MRN: [id number] DOB: Oct 15, 1921 Today's Date: 03/17/2012 Time: 4098-1191 SLP Time Calculation (min): 10 min  Assessment / Plan / Recommendation Clinical Impression  Pt seen prior to discharge to assure pt and son understand all informaton regarding swallowing dysfunction, test findings etc.  Intake only approximately 25% today and pt denied overt problems swallowing.  However please note pt did not have awareness to her deficits prior to esophagram, as she did not correlate overt coughing with intake to aspiration.    Advised son and pt to continue diet restrictions *crush medicine if not contraindicated, masticate meats well- extra gravies/sauces, etc.  Compensatory strategies to mitigate dysphagia pending ENT visit also reviewed.  Son taught back indication for pt to eat several small meals/days to decrease demand on system with pt's dysmotility and Zenker's and if pt overtly coughing with intake, encourage pt to cough to clear and take rest break.  Furhter provided son with written instructions on hemilich maneuver for emergent use.  Son retained and verbalized information re: dysphagia better than pt and pt is to dc home with son, therefore anticipate he will help pt manage.    No further slp indicated as all education completed.       Diet Recommendation  Continue with Current Diet: Dysphagia 3 (mechanical soft);Thin liquid    SLP Plan All goals met   Pertinent Vitals/Pain Afebrile, decreased   Swallowing Goals  SLP Swallowing Goals Swallow Study Goal #2 - Progress: Met  General Temperature Spikes Noted: No Respiratory Status: Room air Behavior/Cognition: Alert;Cooperative;Pleasant mood Oral Cavity - Dentition: Adequate natural dentition Patient Positioning: Upright in bed    Dysphagia Treatment Treatment focused on: Patient/family/caregiver education Family/Caregiver Educated: son, Carrie Morris Patient  observed directly with PO's: No Reason PO's not observed: Other (comment) (pt discharging home, follow up to assure education completed) Feeding: Able to feed self   GO     Donavan Burnet, MS [facility] SLP [phone]

## 2012-03-17 NOTE — Progress Notes (Signed)
ANTICOAGULATION CONSULT NOTE - Follow Up Consult  Pharmacy Consult for Warfarin Indication: atrial fibrillation  No Known Allergies  Patient Measurements: Height: 5\' 2"  (157.5 cm) Weight: 169 lb 15.6 oz (77.1 kg) IBW/kg (Calculated) : 50.1  Vital Signs: Temp: 97.8 F (36.6 C) (02/19 0556) Temp src: Oral (02/19 0556) BP: 112/56 mmHg (02/19 0556) Pulse Rate: 87 (02/19 0556)  Labs:  Recent Labs  03/14/12 1734 03/14/12 2352 03/15/12 0346 03/16/12 0330 03/17/12 0510  HGB 15.6* 12.7 12.3 13.0 12.0  HCT 44.9 36.9 37.7 38.9 35.8*  PLT 245 195 204 204 213  APTT 45* 43*  --   --   --   LABPROT 22.4* 24.0* 24.4* 20.7* 24.9*  INR 2.06* 2.26* 2.32* 1.85* 2.38*  CREATININE 1.18* 1.02 0.99 0.92 0.84  TROPONINI <0.30  --   --   --   --     Estimated Creatinine Clearance: 44.5 ml/min (by C-G formula based on Cr of 0.84).   Medications:  Scheduled:  . azithromycin  500 mg Oral QHS  . cefTRIAXone (ROCEPHIN)  IV  1 g Intravenous Q24H  . diltiazem  60 mg Oral Q6H  . guaiFENesin  600 mg Oral BID  . levothyroxine  75 mcg Oral QAC breakfast  . metronidazole  500 mg Intravenous Q8H  . sodium chloride  3 mL Intravenous Q12H  . [COMPLETED] warfarin  5 mg Oral ONCE-1800  . Warfarin - Pharmacist Dosing Inpatient   Does not apply q1800  . [DISCONTINUED] azithromycin  500 mg Intravenous Q24H   Infusions:  . sodium chloride 50 mL/hr (03/16/12 1755)  . [DISCONTINUED] sodium chloride 75 mL/hr at 03/15/12 1958    Assessment:  89 yof admitted 2/16 for CAP, started ceftriaxone/azithromycin  On chronic warfarin for h/o Afib.  INR is therapeutic on admission.  PTA warfarin dose is 2.5mg  daily, except 5mg  on MWF.  INR now therapeutic (2.38), increased from yesterday (1.85)  CBC:  Hgb and Plt remain wnl and stable.  Noted potential drug interactions with metronidazole and azithromycin (increase sensitivity to warfarin, increase INR), therefore, will give lower-than-usual dose  today.  Goal of Therapy:  INR 2-3   Plan:   Warfarin 2.5mg  PO today at 1800 x1  Daily INR, CBC   Loralee Pacas, PharmD, BCPS Pager: [phone]  03/17/2012 8:30 AM

## 2012-03-21 LAB — CULTURE, BLOOD (ROUTINE X 2)
Culture: NO GROWTH
Culture: NO GROWTH

## 2012-03-23 DIAGNOSIS — K225 Diverticulum of esophagus, acquired: Secondary | ICD-10-CM | POA: Diagnosis not present

## 2012-03-23 DIAGNOSIS — R05 Cough: Secondary | ICD-10-CM | POA: Diagnosis not present

## 2012-03-30 DIAGNOSIS — Z7901 Long term (current) use of anticoagulants: Secondary | ICD-10-CM | POA: Diagnosis not present

## 2012-04-01 DIAGNOSIS — I4891 Unspecified atrial fibrillation: Secondary | ICD-10-CM | POA: Diagnosis not present

## 2012-04-01 DIAGNOSIS — I10 Essential (primary) hypertension: Secondary | ICD-10-CM | POA: Diagnosis not present

## 2012-04-05 DIAGNOSIS — I4891 Unspecified atrial fibrillation: Secondary | ICD-10-CM | POA: Diagnosis not present

## 2012-04-05 DIAGNOSIS — K225 Diverticulum of esophagus, acquired: Secondary | ICD-10-CM | POA: Diagnosis not present

## 2012-04-05 DIAGNOSIS — J309 Allergic rhinitis, unspecified: Secondary | ICD-10-CM | POA: Diagnosis not present

## 2012-04-05 DIAGNOSIS — Z1331 Encounter for screening for depression: Secondary | ICD-10-CM | POA: Diagnosis not present

## 2012-04-05 DIAGNOSIS — E039 Hypothyroidism, unspecified: Secondary | ICD-10-CM | POA: Diagnosis not present

## 2012-04-05 DIAGNOSIS — I10 Essential (primary) hypertension: Secondary | ICD-10-CM | POA: Diagnosis not present

## 2012-04-05 DIAGNOSIS — Z7901 Long term (current) use of anticoagulants: Secondary | ICD-10-CM | POA: Diagnosis not present

## 2012-04-20 DIAGNOSIS — I4891 Unspecified atrial fibrillation: Secondary | ICD-10-CM | POA: Diagnosis not present

## 2012-04-20 DIAGNOSIS — Z7901 Long term (current) use of anticoagulants: Secondary | ICD-10-CM | POA: Diagnosis not present

## 2012-05-20 DIAGNOSIS — I4891 Unspecified atrial fibrillation: Secondary | ICD-10-CM | POA: Diagnosis not present

## 2012-05-20 DIAGNOSIS — Z7901 Long term (current) use of anticoagulants: Secondary | ICD-10-CM | POA: Diagnosis not present

## 2012-06-30 DIAGNOSIS — Z7901 Long term (current) use of anticoagulants: Secondary | ICD-10-CM | POA: Diagnosis not present

## 2012-06-30 DIAGNOSIS — I4891 Unspecified atrial fibrillation: Secondary | ICD-10-CM | POA: Diagnosis not present

## 2012-06-30 DIAGNOSIS — E039 Hypothyroidism, unspecified: Secondary | ICD-10-CM | POA: Diagnosis not present

## 2012-07-08 DIAGNOSIS — Z6832 Body mass index (BMI) 32.0-32.9, adult: Secondary | ICD-10-CM | POA: Diagnosis not present

## 2012-07-08 DIAGNOSIS — I4891 Unspecified atrial fibrillation: Secondary | ICD-10-CM | POA: Diagnosis not present

## 2012-07-08 DIAGNOSIS — J189 Pneumonia, unspecified organism: Secondary | ICD-10-CM | POA: Diagnosis not present

## 2012-07-08 DIAGNOSIS — K225 Diverticulum of esophagus, acquired: Secondary | ICD-10-CM | POA: Diagnosis not present

## 2012-07-08 DIAGNOSIS — E039 Hypothyroidism, unspecified: Secondary | ICD-10-CM | POA: Diagnosis not present

## 2012-08-02 DIAGNOSIS — Z7901 Long term (current) use of anticoagulants: Secondary | ICD-10-CM | POA: Diagnosis not present

## 2012-08-02 DIAGNOSIS — I4891 Unspecified atrial fibrillation: Secondary | ICD-10-CM | POA: Diagnosis not present

## 2012-09-08 DIAGNOSIS — I4891 Unspecified atrial fibrillation: Secondary | ICD-10-CM | POA: Diagnosis not present

## 2012-09-08 DIAGNOSIS — Z7901 Long term (current) use of anticoagulants: Secondary | ICD-10-CM | POA: Diagnosis not present

## 2012-10-12 DIAGNOSIS — I4891 Unspecified atrial fibrillation: Secondary | ICD-10-CM | POA: Diagnosis not present

## 2012-10-12 DIAGNOSIS — Z7901 Long term (current) use of anticoagulants: Secondary | ICD-10-CM | POA: Diagnosis not present

## 2012-11-09 DIAGNOSIS — I4891 Unspecified atrial fibrillation: Secondary | ICD-10-CM | POA: Diagnosis not present

## 2012-11-09 DIAGNOSIS — M109 Gout, unspecified: Secondary | ICD-10-CM | POA: Diagnosis not present

## 2012-11-09 DIAGNOSIS — Z7901 Long term (current) use of anticoagulants: Secondary | ICD-10-CM | POA: Diagnosis not present

## 2012-12-14 DIAGNOSIS — Z7901 Long term (current) use of anticoagulants: Secondary | ICD-10-CM | POA: Diagnosis not present

## 2012-12-14 DIAGNOSIS — I4891 Unspecified atrial fibrillation: Secondary | ICD-10-CM | POA: Diagnosis not present

## 2013-01-13 DIAGNOSIS — I4891 Unspecified atrial fibrillation: Secondary | ICD-10-CM | POA: Diagnosis not present

## 2013-01-13 DIAGNOSIS — Z7901 Long term (current) use of anticoagulants: Secondary | ICD-10-CM | POA: Diagnosis not present

## 2013-01-18 DIAGNOSIS — I4891 Unspecified atrial fibrillation: Secondary | ICD-10-CM | POA: Diagnosis not present

## 2013-01-18 DIAGNOSIS — E039 Hypothyroidism, unspecified: Secondary | ICD-10-CM | POA: Diagnosis not present

## 2013-01-18 DIAGNOSIS — K225 Diverticulum of esophagus, acquired: Secondary | ICD-10-CM | POA: Diagnosis not present

## 2013-01-18 DIAGNOSIS — Z6832 Body mass index (BMI) 32.0-32.9, adult: Secondary | ICD-10-CM | POA: Diagnosis not present

## 2013-01-18 DIAGNOSIS — I10 Essential (primary) hypertension: Secondary | ICD-10-CM | POA: Diagnosis not present

## 2013-01-18 DIAGNOSIS — M109 Gout, unspecified: Secondary | ICD-10-CM | POA: Diagnosis not present

## 2013-01-18 DIAGNOSIS — Z7901 Long term (current) use of anticoagulants: Secondary | ICD-10-CM | POA: Diagnosis not present

## 2013-02-16 DIAGNOSIS — I4891 Unspecified atrial fibrillation: Secondary | ICD-10-CM | POA: Diagnosis not present

## 2013-02-16 DIAGNOSIS — Z7901 Long term (current) use of anticoagulants: Secondary | ICD-10-CM | POA: Diagnosis not present

## 2013-03-08 DIAGNOSIS — H04129 Dry eye syndrome of unspecified lacrimal gland: Secondary | ICD-10-CM | POA: Diagnosis not present

## 2013-03-08 DIAGNOSIS — Z961 Presence of intraocular lens: Secondary | ICD-10-CM | POA: Diagnosis not present

## 2013-03-29 DIAGNOSIS — Z7901 Long term (current) use of anticoagulants: Secondary | ICD-10-CM | POA: Diagnosis not present

## 2013-03-29 DIAGNOSIS — I4891 Unspecified atrial fibrillation: Secondary | ICD-10-CM | POA: Diagnosis not present

## 2013-05-03 DIAGNOSIS — L723 Sebaceous cyst: Secondary | ICD-10-CM | POA: Diagnosis not present

## 2013-05-03 DIAGNOSIS — Z7901 Long term (current) use of anticoagulants: Secondary | ICD-10-CM | POA: Diagnosis not present

## 2013-05-03 DIAGNOSIS — I4891 Unspecified atrial fibrillation: Secondary | ICD-10-CM | POA: Diagnosis not present

## 2013-06-07 DIAGNOSIS — I4891 Unspecified atrial fibrillation: Secondary | ICD-10-CM | POA: Diagnosis not present

## 2013-06-07 DIAGNOSIS — Z7901 Long term (current) use of anticoagulants: Secondary | ICD-10-CM | POA: Diagnosis not present

## 2013-07-07 DIAGNOSIS — I4891 Unspecified atrial fibrillation: Secondary | ICD-10-CM | POA: Diagnosis not present

## 2013-07-07 DIAGNOSIS — Z7901 Long term (current) use of anticoagulants: Secondary | ICD-10-CM | POA: Diagnosis not present

## 2013-07-21 DIAGNOSIS — Z6831 Body mass index (BMI) 31.0-31.9, adult: Secondary | ICD-10-CM | POA: Diagnosis not present

## 2013-07-21 DIAGNOSIS — R5381 Other malaise: Secondary | ICD-10-CM | POA: Diagnosis not present

## 2013-07-21 DIAGNOSIS — M109 Gout, unspecified: Secondary | ICD-10-CM | POA: Diagnosis not present

## 2013-07-21 DIAGNOSIS — I10 Essential (primary) hypertension: Secondary | ICD-10-CM | POA: Diagnosis not present

## 2013-07-21 DIAGNOSIS — R5383 Other fatigue: Secondary | ICD-10-CM | POA: Diagnosis not present

## 2013-07-21 DIAGNOSIS — K225 Diverticulum of esophagus, acquired: Secondary | ICD-10-CM | POA: Diagnosis not present

## 2013-07-21 DIAGNOSIS — Z7901 Long term (current) use of anticoagulants: Secondary | ICD-10-CM | POA: Diagnosis not present

## 2013-07-21 DIAGNOSIS — E039 Hypothyroidism, unspecified: Secondary | ICD-10-CM | POA: Diagnosis not present

## 2013-07-21 DIAGNOSIS — I4891 Unspecified atrial fibrillation: Secondary | ICD-10-CM | POA: Diagnosis not present

## 2013-08-04 DIAGNOSIS — Z7901 Long term (current) use of anticoagulants: Secondary | ICD-10-CM | POA: Diagnosis not present

## 2013-08-04 DIAGNOSIS — I4891 Unspecified atrial fibrillation: Secondary | ICD-10-CM | POA: Diagnosis not present

## 2013-08-26 ENCOUNTER — Observation Stay (HOSPITAL_COMMUNITY)
Admission: EM | Admit: 2013-08-26 | Discharge: 2013-08-27 | Disposition: A | Discharge status: Home / Self Care | Payer: Medicare Other | Attending: Interventional Cardiology | Admitting: Interventional Cardiology

## 2013-08-26 ENCOUNTER — Encounter (HOSPITAL_COMMUNITY): Payer: Self-pay | Admitting: Emergency Medicine

## 2013-08-26 ENCOUNTER — Emergency Department (HOSPITAL_COMMUNITY): Payer: Medicare Other

## 2013-08-26 DIAGNOSIS — M109 Gout, unspecified: Secondary | ICD-10-CM | POA: Diagnosis not present

## 2013-08-26 DIAGNOSIS — I10 Essential (primary) hypertension: Secondary | ICD-10-CM | POA: Diagnosis not present

## 2013-08-26 DIAGNOSIS — N189 Chronic kidney disease, unspecified: Secondary | ICD-10-CM | POA: Diagnosis not present

## 2013-08-26 DIAGNOSIS — Z7901 Long term (current) use of anticoagulants: Secondary | ICD-10-CM | POA: Insufficient documentation

## 2013-08-26 DIAGNOSIS — R079 Chest pain, unspecified: Secondary | ICD-10-CM | POA: Diagnosis not present

## 2013-08-26 DIAGNOSIS — I129 Hypertensive chronic kidney disease with stage 1 through stage 4 chronic kidney disease, or unspecified chronic kidney disease: Secondary | ICD-10-CM | POA: Insufficient documentation

## 2013-08-26 DIAGNOSIS — Z79899 Other long term (current) drug therapy: Secondary | ICD-10-CM | POA: Diagnosis not present

## 2013-08-26 DIAGNOSIS — I482 Chronic atrial fibrillation, unspecified: Secondary | ICD-10-CM | POA: Diagnosis present

## 2013-08-26 DIAGNOSIS — E039 Hypothyroidism, unspecified: Secondary | ICD-10-CM | POA: Diagnosis present

## 2013-08-26 DIAGNOSIS — N179 Acute kidney failure, unspecified: Secondary | ICD-10-CM | POA: Diagnosis not present

## 2013-08-26 DIAGNOSIS — I4891 Unspecified atrial fibrillation: Secondary | ICD-10-CM | POA: Diagnosis not present

## 2013-08-26 HISTORY — DX: Unspecified atrial fibrillation: I48.91

## 2013-08-26 HISTORY — DX: Congenital diverticulum of esophagus: Q39.6

## 2013-08-26 LAB — CBC WITH DIFFERENTIAL/PLATELET
BASOS ABS: 0 10*3/uL (ref 0.0–0.1)
Basophils Relative: 0 % (ref 0–1)
EOS PCT: 1 % (ref 0–5)
Eosinophils Absolute: 0 10*3/uL (ref 0.0–0.7)
HCT: 41.2 % (ref 36.0–46.0)
Hemoglobin: 13.8 g/dL (ref 12.0–15.0)
LYMPHS PCT: 24 % (ref 12–46)
Lymphs Abs: 1.8 10*3/uL (ref 0.7–4.0)
MCH: 31.9 pg (ref 26.0–34.0)
MCHC: 33.5 g/dL (ref 30.0–36.0)
MCV: 95.2 fL (ref 78.0–100.0)
Monocytes Absolute: 0.4 10*3/uL (ref 0.1–1.0)
Monocytes Relative: 6 % (ref 3–12)
NEUTROS ABS: 5.1 10*3/uL (ref 1.7–7.7)
NEUTROS PCT: 69 % (ref 43–77)
PLATELETS: 255 10*3/uL (ref 150–400)
RBC: 4.33 MIL/uL (ref 3.87–5.11)
RDW: 13.6 % (ref 11.5–15.5)
WBC: 7.4 10*3/uL (ref 4.0–10.5)

## 2013-08-26 LAB — BASIC METABOLIC PANEL
ANION GAP: 15 (ref 5–15)
Anion gap: 14 (ref 5–15)
BUN: 20 mg/dL (ref 6–23)
BUN: 20 mg/dL (ref 6–23)
CALCIUM: 9.7 mg/dL (ref 8.4–10.5)
CALCIUM: 9.3 mg/dL (ref 8.4–10.5)
CHLORIDE: 106 meq/L (ref 96–112)
CO2: 21 meq/L (ref 19–32)
CO2: 21 meq/L (ref 19–32)
CREATININE: 1.15 mg/dL — AB (ref 0.50–1.10)
Chloride: 106 mEq/L (ref 96–112)
Creatinine, Ser: 1.27 mg/dL — ABNORMAL HIGH (ref 0.50–1.10)
GFR calc Af Amer: 42 mL/min — ABNORMAL LOW (ref >90)
GFR calc Af Amer: 47 mL/min — ABNORMAL LOW (ref >90)
GFR calc non Af Amer: 36 mL/min — ABNORMAL LOW (ref >90)
GFR calc non Af Amer: 41 mL/min — ABNORMAL LOW (ref >90)
Glucose, Bld: 118 mg/dL — ABNORMAL HIGH (ref 70–99)
Glucose, Bld: 104 mg/dL — ABNORMAL HIGH (ref 70–99)
Potassium: 4.2 mEq/L (ref 3.7–5.3)
Potassium: 4.3 mEq/L (ref 3.7–5.3)
SODIUM: 142 meq/L (ref 137–147)
Sodium: 141 mEq/L (ref 137–147)

## 2013-08-26 LAB — PROTIME-INR
INR: 1.73 — ABNORMAL HIGH (ref 0.00–1.49)
Prothrombin Time: 20.3 seconds — ABNORMAL HIGH (ref 11.6–15.2)

## 2013-08-26 LAB — TROPONIN I

## 2013-08-26 MED ORDER — WARFARIN SODIUM 2.5 MG PO TABS
2.5000 mg | ORAL_TABLET | ORAL | Status: DC
  Filled 2013-08-26: qty 1

## 2013-08-26 MED ORDER — DILTIAZEM HCL 100 MG IV SOLR
5.0000 mg/h | Freq: Once | INTRAVENOUS | Status: AC
  Administered 2013-08-26: 5 mg/h via INTRAVENOUS

## 2013-08-26 MED ORDER — WARFARIN - PHARMACIST DOSING INPATIENT: Freq: Every day | Status: DC

## 2013-08-26 MED ORDER — DILTIAZEM HCL ER COATED BEADS 240 MG PO CP24
240.0000 mg | ORAL_CAPSULE | Freq: Every day | ORAL | Status: DC
  Administered 2013-08-27: 240 mg via ORAL
  Filled 2013-08-26: qty 1

## 2013-08-26 MED ORDER — WARFARIN SODIUM 5 MG PO TABS
5.0000 mg | ORAL_TABLET | ORAL | Status: DC
  Administered 2013-08-27: 5 mg via ORAL
  Filled 2013-08-26: qty 1

## 2013-08-26 MED ORDER — SODIUM CHLORIDE 0.9 % IV SOLN
INTRAVENOUS | Status: AC
  Administered 2013-08-26: 23:00:00 via INTRAVENOUS

## 2013-08-26 MED ORDER — ASPIRIN 81 MG PO CHEW
324.0000 mg | CHEWABLE_TABLET | Freq: Once | ORAL | Status: AC
  Administered 2013-08-26: 324 mg via ORAL
  Filled 2013-08-26: qty 4

## 2013-08-26 MED ORDER — LEVOTHYROXINE SODIUM 75 MCG PO TABS
75.0000 ug | ORAL_TABLET | Freq: Every day | ORAL | Status: DC
  Administered 2013-08-27: 75 ug via ORAL
  Filled 2013-08-26 (×2): qty 1

## 2013-08-26 NOTE — ED Notes (Signed)
Patient c/o left chest pain since yesterday. Has a history of atrial fib.

## 2013-08-26 NOTE — ED Provider Notes (Signed)
CSN: [id number]     Arrival date & time 08/26/13  1326 History   First MD Initiated Contact with Patient 08/26/13 1343     Chief Complaint  Patient presents with  . Chest Pain     (Consider location/radiation/quality/duration/timing/severity/associated sxs/prior Treatment) HPI  This is an 80 year old female with a history of atrial fibrillation who presents with chest pain. Patient reports waxing and waning chest pain since yesterday. She states that it comes and goes. It is pressure-like. Current pain is 3/10. She denies any shortness of breath. She denies any recent cough or fever. She has a history of atrial fibrillation and is on diltiazem. No known history of heart disease. Pain is not worse with exertion.  Past Medical History  Diagnosis Date  . Gout   . Bilateral swelling of feet   . High blood pressure   . Thyroid disease   . E-coli UTI     History of in 11/ 2012 admission  . Dysrhythmia     a-fib  . Atrial fibrillation    No past surgical history on file. History reviewed. No pertinent family history. History  Substance Use Topics  . Smoking status: Never Smoker   . Smokeless tobacco: Never Used  . Alcohol Use: No   OB History   Grav Para Term Preterm Abortions TAB SAB Ect Mult Living                 Review of Systems  Constitutional: Negative for fever.  Respiratory: Positive for chest tightness. Negative for cough and shortness of breath.   Cardiovascular: Positive for chest pain.  Gastrointestinal: Negative for nausea, vomiting and abdominal pain.  Genitourinary: Negative for dysuria.  Skin: Negative for wound.  Neurological: Negative for headaches.  Psychiatric/Behavioral: Negative for confusion.  All other systems reviewed and are negative.     Allergies  Review of patient's allergies indicates no known allergies.  Home Medications   Prior to Admission medications   Medication Sig Start Date End Date Taking? Authorizing Provider  levothyroxine  (SYNTHROID, LEVOTHROID) 75 MCG tablet Take 75 mcg by mouth daily.     Yes Historical Provider, MD  warfarin (COUMADIN) 5 MG tablet Take 2.5-5 mg by mouth See admin instructions. Monday, wed, and Friday she takes a full tablet (5mg ) ,tuesday Thursday Saturday and Sunday she takes 1/2 tablet (2.5mg )   Yes Historical Provider, MD  diltiazem (CARDIZEM CD) 240 MG 24 hr capsule Take 1 capsule (240 mg total) by mouth daily. 03/17/12 06/25/13  Kela Millin, MD   BP 114/72  Temp(Src) 98.4 F (36.9 C) (Oral)  Resp 20  SpO2 94% Physical Exam  Nursing note and vitals reviewed. Constitutional: She is oriented to person, place, and time. No distress.  Elderly  HENT:  Head: Normocephalic and atraumatic.  Eyes: Pupils are equal, round, and reactive to light.  Neck: Neck supple. No JVD present.  Cardiovascular: Normal heart sounds.   Irregularly irregular rate, tachycardia  Pulmonary/Chest: Effort normal. No respiratory distress. She has no wheezes. She has no rales.  Abdominal: Soft. Bowel sounds are normal. There is no tenderness. There is no rebound.  Musculoskeletal:  Trace lower extremity edema  Neurological: She is alert and oriented to person, place, and time.  Skin: Skin is warm and dry.  Psychiatric: She has a normal mood and affect.    ED Course  Procedures (including critical care time)  CRITICAL CARE Performed by: Ross Marcus, F   Total critical care time: 35 min  Critical care time was exclusive of separately billable procedures and treating other patients.  Critical care was necessary to treat or prevent imminent or life-threatening deterioration.  Critical care was time spent personally by me on the following activities: development of treatment plan with patient and/or surrogate as well as nursing, discussions with consultants, evaluation of patient's response to treatment, examination of patient, obtaining history from patient or surrogate, ordering and performing  treatments and interventions, ordering and review of laboratory studies, ordering and review of radiographic studies, pulse oximetry and re-evaluation of patient's condition.  Labs Review Labs Reviewed - No data to display  Imaging Review No results found.   EKG Interpretation   Date/Time:  Friday August 26 2013 13:33:10 EDT Ventricular Rate:  129 PR Interval:    QRS Duration: 86 QT Interval:  312 QTC Calculation: 457 R Axis:   -33 Text Interpretation:  Atrial fibrillation Ventricular premature complex  Left axis deviation Low voltage, extremity and precordial leads Consider  anterior infarct Confirmed by HORTON  MD, Toni AmendOURTNEY ([id number]) on 08/26/2013  4:20:54 PM      MDM   Final diagnoses:  Chest pain, unspecified chest pain type  Acute renal failure, unspecified acute renal failure type  Atrial fibrillation with rapid ventricular response  Essential hypertension   Patient presents with CP and is in afib with RVR.  No evidence of volume overload.  No history of CAD.  Otherwise hemodynamically stable.  Patient started on low dose diltiazem drip with good rate control.  EKG, chest xray and other work-up is largely reassuring.  Patient of Dr. Herbie BaltimoreHarding.  Cardiology consulted for further evaluation.  Shon Batonourtney F Horton, MD 08/28/13 [phone]

## 2013-08-26 NOTE — H&P (Addendum)
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 female.   Chief Complaint: Chest pain HPI:   The patient is a 80 year old female, who sees Dr. Ellyn Hack once a year (per the son) for a history of atrial fibrillation on Coumadin, hypertension, thyroid disease and gout.  Her last echocardiogram was November 2012 and was essentially normal with the exception of mild mitralvalve prolapse involving the anterior leaflet.  Patient presents today with chest pain. Patient reports the pain is dull in nature is over towards her left axilla at worst it was 1-2/10.  It would come and go at random times. There is no associated nausea, vomiting, shortness of breath, diaphoresis or radiation.  He did not get worse if she walked across the room.  She also denies orthopnea, PND, lower extremity edema, abdominal pain, hematuria, hematochezia, melena.  Percent has a small O2 sensor and replaced on her finger her heartrate was in the 120s to 130s with that he bring her over to the emergency room.   She takes Cardizem CD 240 mg daily.  Medications: Prior to Admission medications   Medication Sig Start Date End Date Taking? Authorizing Provider  diltiazem (CARDIZEM CD) 240 MG 24 hr capsule Take 1 capsule (240 mg total) by mouth daily. 03/17/12 08/26/13 Yes Adeline C Viyuoh, MD  levothyroxine (SYNTHROID, LEVOTHROID) 75 MCG tablet Take 75 mcg by mouth daily.     Yes Historical Provider, MD  warfarin (COUMADIN) 5 MG tablet Take 2.5-5 mg by mouth See admin instructions. Monday  and Friday she takes a full tablet ($RemoveBefo'5mg'IvuzFjYMEwS$ ) ,Tuesday, Wednesday, Thursday Saturday and Sunday she takes 1/2 tablet (2.$RemoveBefor'5mg'PBRvRvrHLrSf$ )   Yes Historical Provider, MD     Past Medical History  Diagnosis Date  . Gout   . Bilateral swelling of feet   . High blood pressure   . Thyroid disease   . E-coli UTI     History of in 11/ 2012 admission  . Dysrhythmia     a-fib  . Atrial fibrillation   . Esophageal diverticulum     Past Surgical History  Procedure  Laterality Date  . Breast surgery      benign cyst removed    Family History  Problem Relation Age of Onset  . Heart attack Father     He was in his 62's   Social History:  reports that she has never smoked. She has never used smokeless tobacco. She reports that she does not drink alcohol or use illicit drugs.  Allergies: No Known Allergies   (Not in a hospital admission)  Results for orders placed during the hospital encounter of 08/26/13 (from the past 48 hour(s))  CBC WITH DIFFERENTIAL     Status: None   Collection Time    08/26/13  1:56 PM      Result Value Ref Range   WBC 7.4  4.0 - 10.5 K/uL   RBC 4.33  3.87 - 5.11 MIL/uL   Hemoglobin 13.8  12.0 - 15.0 g/dL   HCT 41.2  36.0 - 46.0 %   MCV 95.2  78.0 - 100.0 fL   MCH 31.9  26.0 - 34.0 pg   MCHC 33.5  30.0 - 36.0 g/dL   RDW 13.6  11.5 - 15.5 %   Platelets 255  150 - 400 K/uL   Neutrophils Relative % 69  43 - 77 %   Neutro Abs 5.1  1.7 - 7.7 K/uL   Lymphocytes Relative 24  12 - 46 %  Lymphs Abs 1.8  0.7 - 4.0 K/uL   Monocytes Relative 6  3 - 12 %   Monocytes Absolute 0.4  0.1 - 1.0 K/uL   Eosinophils Relative 1  0 - 5 %   Eosinophils Absolute 0.0  0.0 - 0.7 K/uL   Basophils Relative 0  0 - 1 %   Basophils Absolute 0.0  0.0 - 0.1 K/uL  BASIC METABOLIC PANEL     Status: Abnormal   Collection Time    08/26/13  1:56 PM      Result Value Ref Range   Sodium 142  137 - 147 mEq/L   Potassium 4.2  3.7 - 5.3 mEq/L   Chloride 106  96 - 112 mEq/L   CO2 21  19 - 32 mEq/L   Glucose, Bld 118 (*) 70 - 99 mg/dL   BUN 20  6 - 23 mg/dL   Creatinine, Ser 1.27 (*) 0.50 - 1.10 mg/dL   Calcium 9.7  8.4 - 10.5 mg/dL   GFR calc non Af Amer 36 (*) >90 mL/min   GFR calc Af Amer 42 (*) >90 mL/min   Comment: (NOTE)     The eGFR has been calculated using the CKD EPI equation.     This calculation has not been validated in all clinical situations.     eGFR's persistently <90 mL/min signify possible Chronic Kidney     Disease.   Anion  gap 15  5 - 15  TROPONIN I     Status: None   Collection Time    08/26/13  1:56 PM      Result Value Ref Range   Troponin I <0.30  <0.30 ng/mL   Comment:            Due to the release kinetics of cTnI,     a negative result within the first hours     of the onset of symptoms does not rule out     myocardial infarction with certainty.     If myocardial infarction is still suspected,     repeat the test at appropriate intervals.  PROTIME-INR     Status: Abnormal   Collection Time    08/26/13  1:56 PM      Result Value Ref Range   Prothrombin Time 20.3 (*) 11.6 - 15.2 seconds   INR 1.73 (*) 0.00 - 1.49   Dg Chest Portable 1 View  08/26/2013   CLINICAL DATA:  Left-sided chest pain.  EXAM: PORTABLE CHEST - 1 VIEW  COMPARISON:  03/14/2012  FINDINGS: Lungs are adequately inflated without consolidation or effusion. Mild stable cardiac prominence. Evidence of patient's known large hiatal hernia. Calcified plaque over the aortic arch. Remainder of the exam is unchanged.  IMPRESSION: No active disease.  Large hiatal hernia.  Mild stable cardiomegaly.   Electronically Signed   By: Marin Olp M.D.   On: 08/26/2013 14:43    Review of Systems  Constitutional: Negative for fever and diaphoresis.  HENT: Negative for congestion.   Respiratory: Positive for sputum production (chronic). Negative for cough and shortness of breath.   Cardiovascular: Positive for chest pain (The left axilla 2/10, dull, comes and goes). Negative for palpitations, orthopnea, leg swelling and PND.  Gastrointestinal: Negative for nausea, vomiting, blood in stool and melena.  Genitourinary: Negative for hematuria.  Neurological: Negative for dizziness and weakness.  All other systems reviewed and are negative.   Blood pressure 111/60, pulse 92, temperature 98.4 F (36.9 C), temperature source Oral,  resp. rate 24, SpO2 94.00%. Physical Exam  Nursing note and vitals reviewed. Constitutional: She is oriented to person,  place, and time. She appears well-developed. No distress.  Obese  HENT:  Head: Normocephalic and atraumatic.  Mouth/Throat: Oropharynx is clear and moist.  Eyes: EOM are normal. Pupils are equal, round, and reactive to light. No scleral icterus.  Neck: Normal range of motion. Neck supple.  Cardiovascular: Normal rate, S1 normal and S2 normal.  An irregularly irregular rhythm present.  No murmur heard. Pulses:      Radial pulses are 2+ on the right side, and 2+ on the left side.       Dorsalis pedis pulses are 2+ on the right side, and 2+ on the left side.  No carotid bruit  Respiratory: Effort normal and breath sounds normal. She has no wheezes. She has no rales. She exhibits no tenderness.  GI: Soft. Bowel sounds are normal. She exhibits no distension. There is no tenderness.  Musculoskeletal: She exhibits no edema.  Lymphadenopathy:    She has no cervical adenopathy.  Neurological: She is alert and oriented to person, place, and time. She exhibits normal muscle tone.  Skin: Skin is warm and dry.  Psychiatric: She has a normal mood and affect.     Assessment/Plan Active Problems:   Chest pain Appears atypical and is likely noncardiac. Initial troponin is negative. We'll continue cycle troponin.  EKG showed A. fib with rapid ventricular response.  No signs or symptoms of CHF.     Atrial fibrillation with rapid ventricular response Permanent A-fib. She is on Coumadin, however, her INR is subtherapeutic at 1.73.  Coumadin per pharmacy.  We'll convert to by mouth diltiazem 240 mg tonight as the 49m /hr of IV has controlled her rate well.   Consider a 2D echo.  Check TSH.     HTN (hypertension)  well-controlled    Hypothyroidism  On Synthroid 75 mcg daily   Acute on Chronic kidney disease Patient kidney function appears to be decreased compared to February 2014. She was stage II and now  stage III(Age related).  Will start IV fluids at 75/hr for 7 hours.   HTarri Fuller  PA-C 08/26/2013, 5:52 PM   I have examined the patient and reviewed assessment and plan and discussed with patient.  Agree with above as stated.  Incrased HR in the setting of chronic AFib.  INR subtherapeutic.  Will gently hydrate.  R/o for MI.  Heparin until INR therapeutic.  Her sx do not seem to be angina.  Would focus on rate control.  Given her age and low likelihood that we would perform invasive w/u, would not plan on stress test.  Treat conservatively.  Possible d/c tomorrow if Cr better and rate control better.  Deloma Spindle S.

## 2013-08-27 DIAGNOSIS — I4891 Unspecified atrial fibrillation: Secondary | ICD-10-CM | POA: Diagnosis not present

## 2013-08-27 LAB — TROPONIN I: Troponin I: <0.3 ng/mL (ref <0.30)

## 2013-08-27 LAB — PROTIME-INR
INR: 1.72 — ABNORMAL HIGH (ref 0.00–1.49)
PROTHROMBIN TIME: 20.2 s — AB (ref 11.6–15.2)

## 2013-08-27 LAB — TSH: TSH: 0.418 u[IU]/mL (ref 0.350–4.500)

## 2013-08-27 MED ORDER — DILTIAZEM HCL ER COATED BEADS 300 MG PO CP24: 300.0000 mg | ORAL_CAPSULE | Freq: Every day | ORAL | Status: DC

## 2013-08-27 NOTE — Discharge Instructions (Signed)
Change her diltiazem to 300 mg daily from 240 mg. Take a total of 5 mg of warfarin today and tomorrow and then resume her normal dose and have it checked early your  primary physician's office.

## 2013-08-27 NOTE — Progress Notes (Signed)
UR completed 

## 2013-08-27 NOTE — Discharge Summary (Signed)
Physician Discharge Summary  Patient ID: Carrie Morris MRN: [id number] DOB/AGE: 1921-08-09 80 y.o.  Admit date: 08/26/2013 Discharge date: 08/27/2013  Primary Physician:  Dr. Larina Earthly  Primary Discharge Diagnosis:  1. Chest pain of uncertain cause due to atrial fibrillation with rapid ventricular response  Secondary Discharge Diagnosis: 2. Chronic atrial fibrillation 3. Hypertension 4. Hypothyroidism 5. Long-term anticoagulation with warfarin-suboptimal on admission 6. Acute kidney failure resolved  Hospital Course: This 80 year old female has a history of atrial fibrillation and is maintained on chronic warfarin therapy. She was in her usual state of health at home and began to have some dull left-sided chest discomfort. The discomfort was not related to exertion and her pulse oximetry was checked at home and noted to have a rapid pulse rate and she was brought to the emergency room by her son. No associated nausea vomiting shortness of breath diaphoresis or radiation. She had no PND orthopnea. She was found to be in rapid atrial fibrillation was brought in to the hospital.  After she was admitted she was given intravenous diltiazem which resulted in a rate slowing. Her warfarin was therapeutic. Serial troponins were all negative. She received some intravenous fluids overnight which resulted in improvement in her creatinine TSH was normal and blood pressure was well controlled. Creatinine had improved the next morning she was feeling fine. Her atrial fibrillation remained in good control overnight. She will be discharged on a higher dose of diltiazem 300 mg daily and will have early followup for pro time next week at her primary care doctor's office and see Dr. Herbie Baltimore within the week.  Discharge Exam: Blood pressure 146/67, pulse 69, temperature 97.5 F (36.4 C), temperature source Oral, resp. rate 19, height 5' (1.524 m), weight 70.7 kg (155 lb 13.8 oz), SpO2 97.00%. Weight: 70.7 kg (155 lb  13.8 oz) Elderly talkative female in no acute distress, lungs clear, no S3 irregular rhythm  Labs: CBC:   Lab Results  Component Value Date   WBC 7.4 08/26/2013   HGB 13.8 08/26/2013   HCT 41.2 08/26/2013   MCV 95.2 08/26/2013   PLT 255 08/26/2013    CMP:  Recent Labs Lab 08/26/13 2001  NA 141  K 4.3  CL 106  CO2 21  BUN 20  CREATININE 1.15*  CALCIUM 9.3  GLUCOSE 104*    Lipid Panel     Component Value Date/Time   CHOL 138 11/28/2010 1403   TRIG 79 11/28/2010 1403   HDL 58 11/28/2010 1403   CHOLHDL 2.4 11/28/2010 1403   VLDL 16 11/28/2010 1403   LDLCALC 64 11/28/2010 1403    Cardiac Enzymes:  Recent Labs  08/26/13 1356 08/26/13 2310 08/27/13 0456  TROPONINI <0.30 <0.30 <0.30    BNP (last 3 results) No results found for this basename: PROBNP,  in the last 8760 hours  Protime: No components found with this basename: PT,   Thyroid: Lab Results  Component Value Date   TSH 0.418 08/26/2013    Hemoglobin A1C: Lab Results  Component Value Date   HGBA1C 5.5 11/27/2010     Radiology: Large hiatal hernia, stable cardiomegaly  EKG: Initially atrial fibrillation with rapid ventricular response  Discharge Medications:   Medication List         diltiazem 300 MG 24 hr capsule  Commonly known as:  CARDIZEM CD  Take 1 capsule (300 mg total) by mouth daily.     levothyroxine 75 MCG tablet  Commonly known as:  SYNTHROID, LEVOTHROID  Take 75  mcg by mouth daily.     warfarin 5 MG tablet  Commonly known as:  COUMADIN  Take 2.5-5 mg by mouth See admin instructions. Monday  and Friday she takes a full tablet (5mg ) ,Tuesday, Wednesday, Thursday Saturday and Sunday she takes 1/2 tablet (2.5mg )        Followup plans and appointments: Followup with Dr. Felipa EthAvva early next week for pro time, see Dr. Herbie BaltimoreHarding early next week  Time spent with patient to include physician time:  30 minutes  Signed: W. Ashley RoyaltySpencer Macaila Tahir, Jr. MD [facility]ACC 08/27/2013, 7:57 AM

## 2013-08-27 NOTE — Progress Notes (Signed)
Pt left at this time with her son at her side. Pt alert, oriented, and without c/o. Discharge instructions/prescription given/explained with pt and son verbalizing understanding.  Followup appointments noted. Pt left with cane.

## 2013-08-27 NOTE — Progress Notes (Signed)
ANTICOAGULATION CONSULT NOTE - Initial Consult  Pharmacy Consult for warfarin Indication: atrial fibrillation  No Known Allergies  Patient Measurements: Height: 5' (152.4 cm) Weight: 155 lb 13.8 oz (70.7 kg) IBW/kg (Calculated) : 45.5 Heparin Dosing Weight:   Vital Signs: Temp: 97.5 F (36.4 C) (08/01 0521) Temp src: Oral (08/01 0521) BP: 146/67 mmHg (08/01 0521) Pulse Rate: 69 (08/01 0521)  Labs:  Recent Labs  08/26/13 1356 08/26/13 2001 08/26/13 2310  HGB 13.8  --   --   HCT 41.2  --   --   PLT 255  --   --   LABPROT 20.3*  --   --   INR 1.73*  --   --   CREATININE 1.27* 1.15*  --   TROPONINI <0.30  --  <0.30    Estimated Creatinine Clearance: 29.1 ml/min (by C-G formula based on Cr of 1.15).   Medical History: Past Medical History  Diagnosis Date  . Gout   . Bilateral swelling of feet   . High blood pressure   . Thyroid disease   . E-coli UTI     History of in 11/ 2012 admission  . Dysrhythmia     a-fib  . Atrial fibrillation   . Esophageal diverticulum     Medications:  Prescriptions prior to admission  Medication Sig Dispense Refill  . diltiazem (CARDIZEM CD) 240 MG 24 hr capsule Take 1 capsule (240 mg total) by mouth daily.  30 capsule  3  . levothyroxine (SYNTHROID, LEVOTHROID) 75 MCG tablet Take 75 mcg by mouth daily.        Marland Kitchen. warfarin (COUMADIN) 5 MG tablet Take 2.5-5 mg by mouth See admin instructions. Monday  and Friday she takes a full tablet (5mg ) ,Tuesday, Wednesday, Thursday Saturday and Sunday she takes 1/2 tablet (2.5mg )       Scheduled:  . diltiazem  240 mg Oral Daily  . levothyroxine  75 mcg Oral QAC breakfast  . warfarin  2.5 mg Oral Once per day on Sun Tue Wed Thu Sat  . warfarin  5 mg Oral Once per day on Mon Fri  . Warfarin - Pharmacist Dosing Inpatient   Does not apply q1800    Assessment: Patient with Low INR on admit, on chronic warfarin for afib.    Goal of Therapy:  INR 2-3    Plan:  Warfarin dosing as per home  dosing regimen. Daily INR.  Aleene DavidsonGrimsley Jr, Xerxes Agrusa Crowford 08/27/2013,5:24 AM

## 2013-08-30 ENCOUNTER — Ambulatory Visit: Payer: Medicare Other | Admitting: Cardiology

## 2013-08-31 DIAGNOSIS — Z7901 Long term (current) use of anticoagulants: Secondary | ICD-10-CM | POA: Diagnosis not present

## 2013-08-31 DIAGNOSIS — I4891 Unspecified atrial fibrillation: Secondary | ICD-10-CM | POA: Diagnosis not present

## 2013-09-26 ENCOUNTER — Ambulatory Visit (INDEPENDENT_AMBULATORY_CARE_PROVIDER_SITE_OTHER): Payer: Medicare Other | Admitting: Cardiology

## 2013-09-26 ENCOUNTER — Encounter: Payer: Self-pay | Admitting: Cardiology

## 2013-09-26 VITALS — BP 130/80 | HR 99 | Ht 60.0 in | Wt 157.0 lb

## 2013-09-26 DIAGNOSIS — I4891 Unspecified atrial fibrillation: Secondary | ICD-10-CM | POA: Diagnosis not present

## 2013-09-26 DIAGNOSIS — I10 Essential (primary) hypertension: Secondary | ICD-10-CM | POA: Diagnosis not present

## 2013-09-26 MED ORDER — METOPROLOL TARTRATE 25 MG PO TABS: 25.0000 mg | ORAL_TABLET | Freq: Two times a day (BID) | ORAL | Status: DC | PRN

## 2013-09-26 NOTE — Patient Instructions (Signed)
If HEART RATE STAYS ABOVE 110 .  You may take Metoprolol Tartrate 25 mg recheck in 6 hours if heart rate is still above in 110 take another tablet  If still increase heart rate go to ER.  CONTINUE WITH CURRENT MEDICATIONS   Your physician wants you to follow-up in 6 MONTH Dr Herbie Baltimore. 30 MIN APPOINTMENT.  You will receive a reminder letter in the mail two months in advance. If you don't receive a letter, please call our office to schedule the follow-up appointment.Carrie Morris

## 2013-09-28 ENCOUNTER — Encounter: Payer: Self-pay | Admitting: Cardiology

## 2013-09-28 NOTE — Assessment & Plan Note (Signed)
Stable on current regimen. She should have blood pressure room to use metoprolol if need be as a PRN agent for rate control.

## 2013-09-28 NOTE — Assessment & Plan Note (Signed)
She really does have a rapid rate with a rate in. That's when she becomes symptomatic. In the past year her grades have usually been in the high 80s to low 90s. I'm reluctant to go any higher on her calcium channel blocker. Would consider the possibility of a beta blocker if her rates increase again. Could also consider digoxin. Would not use a true antiarrhythmic agents unless overly symptomatic.  Plan: Continue with current dose of diltiazem. Anticoagulated with warfarin. Will use when necessary dose of Lopressor for heart rate greater than 110 beats per minute. The intention would be to avoid emergency room visits in the future.

## 2013-09-28 NOTE — Progress Notes (Signed)
PCP: Hoyle Sauer, MD  Clinic Note: Chief Complaint  Patient presents with  . Follow-up    post hospital, no chest pain , no sob, no edema, increase heart rate    HPI: Carrie Morris is a 80 y.o. female with a Cardiovascular Problem List below who presents today for patient after being monitored overnight observation for her A. fib with RVR. She basically woke up the morning of July 31 with an uneasy sensation in her chest. She describes a goal sensation moving over to her left axilla. She described to me as a tingling sensation in her chest. It was relatively random not associated with any other symptoms. She said it was not exacerbated with exertion. On evaluation she was found to be in A. fib with heart rate in the 120s to 130s. She was monitored overnight and her Cardizem dose was increased to 300 mg. She discharged the following day. She rarely also had mild elevation in her creatinine then improved. As usual the case, she is accompanied by her son today.  Interval History: She's been relatively stable since her discharge of has not had any more of those episodes. She is not very active, denies any significant recurrence of chest discomfort with rest or exertion. No resting or exertional dyspnea, she basis it doesn't do much. She denies any real sensation of rapid irregular heart beats although she is pretty much persistently in A. fib. She has not noted any melena, hematochezia, hematuria or epistaxis from the warfarin therapy. No PND orthopnea or edema. No syncope or near-syncopal type symptoms, amaurosis fugax or TIA symptoms. No claudication. Her main complaint was that she was upset about having to spend the night in the hospital.  Past Medical History  Diagnosis Date  . Gout   . Bilateral swelling of feet   . High blood pressure   . Thyroid disease   . E-coli UTI     History of in 11/ 2012 admission  . Dysrhythmia     a-fib  . Atrial fibrillation   . Esophageal diverticulum      Prior Cardiac Evaluation and Past Surgical History: Past Surgical History  Procedure Laterality Date  . Breast surgery      benign cyst removed  . Transthoracic echocardiogram  November 2012    EF 60-65%. Normal LV size and function. Mild mitral prolapse and mild regurgitation. Mild LA dilation.    MEDICATIONS AND ALLERGIES REVIEWED IN EPIC No Change in Social and Family History  ROS: A comprehensive Review of Systems - was performed Review of Systems  Constitutional: Negative for fever, chills and weight loss.       Not recently. But has lost 1214 pounds since February  HENT: Negative for nosebleeds.   Eyes: Negative for blurred vision and double vision.  Respiratory: Negative for hemoptysis, sputum production, shortness of breath and wheezing.   Cardiovascular:       Negative per history of present illness  Gastrointestinal: Negative for heartburn, blood in stool and melena.  Genitourinary: Negative for hematuria.  Neurological: Negative for dizziness, tingling, tremors, sensory change, speech change, focal weakness, seizures and loss of consciousness.  Endo/Heme/Allergies: Does not bruise/bleed easily.  Psychiatric/Behavioral: Negative for depression. The patient is not nervous/anxious.   All other systems reviewed and are negative.   Wt Readings from Last 3 Encounters:  09/26/13 157 lb (71.215 kg)  08/26/13 155 lb 13.8 oz (70.7 kg)  03/17/12 169 lb 15.6 oz (77.1 kg)   PHYSICAL EXAM BP  130/80  Pulse 99  Ht 5' (1.524 m)  Wt 157 lb (71.215 kg)  BMI 30.66 kg/m2 General appearance: Very pleasant but somewhat grouchy elderly woman. alert, cooperative, appears stated age, no distress and mildly obese Neck: no adenopathy, no carotid bruit and no JVD Lungs: clear to auscultation bilaterally, normal percussion bilaterally and non-labored Heart: Irregularly irregular rhythm with borderline tachycardic rate., S1, S2 normal, no murmur, click, rub or gallop nondisplaced  PMI Abdomen: soft, non-tender; bowel sounds normal; no masses,  no organomegaly; Extremities: extremities normal, atraumatic, no cyanosis, trace edema; Pulses: 2+ and symmetric; Neurologic: Mental status: Alert, oriented, thought content appropriate Cranial nerves: normal (II-XII grossly intact)    Adult ECG Report  Rate: 99 ;  Rhythm: atrial fibrillation overall low voltage but otherwise normal. -   Recent Labs : None since hospital stay  ASSESSMENT / PLAN: Chronic atrial fibrillation She really does have a rapid rate with a rate in. That's when she becomes symptomatic. In the past year her grades have usually been in the high 80s to low 90s. I'm reluctant to go any higher on her calcium channel blocker. Would consider the possibility of a beta blocker if her rates increase again. Could also consider digoxin. Would not use a true antiarrhythmic agents unless overly symptomatic.  Plan: Continue with current dose of diltiazem. Anticoagulated with warfarin. Will use when necessary dose of Lopressor for heart rate greater than 110 beats per minute. The intention would be to avoid emergency room visits in the future.  Essential hypertension Stable on current regimen. She should have blood pressure room to use metoprolol if need be as a PRN agent for rate control.    Orders Placed This Encounter  Procedures  . EKG 12-Lead   Meds ordered this encounter  Medications  . metoprolol tartrate (LOPRESSOR) 25 MG tablet    Sig: Take 1 tablet (25 mg total) by mouth 2 (two) times daily as needed.    Dispense:  60 tablet    Refill:  4    Followup: 6  months  DAVID W. Herbie Baltimore, M.D., M.S. Interventional Cardiologist [facility]

## 2013-10-12 DIAGNOSIS — I4891 Unspecified atrial fibrillation: Secondary | ICD-10-CM | POA: Diagnosis not present

## 2013-10-12 DIAGNOSIS — Z7901 Long term (current) use of anticoagulants: Secondary | ICD-10-CM | POA: Diagnosis not present

## 2013-11-17 DIAGNOSIS — Z7901 Long term (current) use of anticoagulants: Secondary | ICD-10-CM | POA: Diagnosis not present

## 2013-11-17 DIAGNOSIS — I480 Paroxysmal atrial fibrillation: Secondary | ICD-10-CM | POA: Diagnosis not present

## 2013-12-20 DIAGNOSIS — I480 Paroxysmal atrial fibrillation: Secondary | ICD-10-CM | POA: Diagnosis not present

## 2013-12-20 DIAGNOSIS — Z7901 Long term (current) use of anticoagulants: Secondary | ICD-10-CM | POA: Diagnosis not present

## 2013-12-28 ENCOUNTER — Other Ambulatory Visit: Payer: Self-pay

## 2013-12-28 ENCOUNTER — Telehealth: Payer: Self-pay | Admitting: Cardiology

## 2013-12-28 MED ORDER — DILTIAZEM HCL ER COATED BEADS 300 MG PO CP24: 300.0000 mg | ORAL_CAPSULE | Freq: Every day | ORAL | Status: AC

## 2013-12-28 NOTE — Telephone Encounter (Signed)
Spoke with patient, patient needed med refilled. Had contacted PCP, was deferred to our office for rx refill. Has been reordered to pharmacy of pt's preference.

## 2013-12-28 NOTE — Telephone Encounter (Signed)
Son called in stating that Ms. Carrie Morris is out of Diltazem and needs a new prescription [facility] [address]. Please call  Thanks

## 2013-12-28 NOTE — Telephone Encounter (Signed)
Returned phone call to pt, rx refill request completed for Diltiazem 300mg . PCP office [facility] Fel[facility] nurse had also contacted regarding question of Rx and last visit. Information from recent visit with Dr. Herbie BaltimoreHarding faxed to requesting office.

## 2013-12-28 NOTE — Telephone Encounter (Signed)
See previous note 12/2 phone note.

## 2014-01-16 DIAGNOSIS — Z1389 Encounter for screening for other disorder: Secondary | ICD-10-CM | POA: Diagnosis not present

## 2014-01-16 DIAGNOSIS — I480 Paroxysmal atrial fibrillation: Secondary | ICD-10-CM | POA: Diagnosis not present

## 2014-01-16 DIAGNOSIS — R350 Frequency of micturition: Secondary | ICD-10-CM | POA: Diagnosis not present

## 2014-01-16 DIAGNOSIS — R5383 Other fatigue: Secondary | ICD-10-CM | POA: Diagnosis not present

## 2014-01-16 DIAGNOSIS — R8299 Other abnormal findings in urine: Secondary | ICD-10-CM | POA: Diagnosis not present

## 2014-01-16 DIAGNOSIS — I10 Essential (primary) hypertension: Secondary | ICD-10-CM | POA: Diagnosis not present

## 2014-01-16 DIAGNOSIS — M109 Gout, unspecified: Secondary | ICD-10-CM | POA: Diagnosis not present

## 2014-01-16 DIAGNOSIS — K225 Diverticulum of esophagus, acquired: Secondary | ICD-10-CM | POA: Diagnosis not present

## 2014-01-16 DIAGNOSIS — Z7901 Long term (current) use of anticoagulants: Secondary | ICD-10-CM | POA: Diagnosis not present

## 2014-02-16 DIAGNOSIS — Z7901 Long term (current) use of anticoagulants: Secondary | ICD-10-CM | POA: Diagnosis not present

## 2014-02-16 DIAGNOSIS — I480 Paroxysmal atrial fibrillation: Secondary | ICD-10-CM | POA: Diagnosis not present

## 2014-03-20 ENCOUNTER — Encounter: Payer: Self-pay | Admitting: *Deleted

## 2014-03-21 DIAGNOSIS — Z7901 Long term (current) use of anticoagulants: Secondary | ICD-10-CM | POA: Diagnosis not present

## 2014-03-21 DIAGNOSIS — I480 Paroxysmal atrial fibrillation: Secondary | ICD-10-CM | POA: Diagnosis not present

## 2014-03-28 ENCOUNTER — Ambulatory Visit: Payer: Medicare Other | Admitting: Cardiology

## 2014-04-19 DIAGNOSIS — Z7901 Long term (current) use of anticoagulants: Secondary | ICD-10-CM | POA: Diagnosis not present

## 2014-04-19 DIAGNOSIS — I480 Paroxysmal atrial fibrillation: Secondary | ICD-10-CM | POA: Diagnosis not present

## 2014-05-25 DIAGNOSIS — Z7901 Long term (current) use of anticoagulants: Secondary | ICD-10-CM | POA: Diagnosis not present

## 2014-05-25 DIAGNOSIS — I480 Paroxysmal atrial fibrillation: Secondary | ICD-10-CM | POA: Diagnosis not present

## 2014-06-28 DIAGNOSIS — Z7901 Long term (current) use of anticoagulants: Secondary | ICD-10-CM | POA: Diagnosis not present

## 2014-06-28 DIAGNOSIS — I480 Paroxysmal atrial fibrillation: Secondary | ICD-10-CM | POA: Diagnosis not present

## 2014-07-05 ENCOUNTER — Encounter (HOSPITAL_COMMUNITY): Payer: Self-pay | Admitting: Emergency Medicine

## 2014-07-05 ENCOUNTER — Emergency Department (HOSPITAL_COMMUNITY): Payer: Medicare Other

## 2014-07-05 ENCOUNTER — Emergency Department (HOSPITAL_COMMUNITY)
Admission: EM | Admit: 2014-07-05 | Discharge: 2014-07-05 | Disposition: A | Discharge status: Home / Self Care | Payer: Medicare Other | Attending: Emergency Medicine | Admitting: Emergency Medicine

## 2014-07-05 DIAGNOSIS — Q396 Congenital diverticulum of esophagus: Secondary | ICD-10-CM | POA: Diagnosis not present

## 2014-07-05 DIAGNOSIS — E079 Disorder of thyroid, unspecified: Secondary | ICD-10-CM | POA: Insufficient documentation

## 2014-07-05 DIAGNOSIS — I4891 Unspecified atrial fibrillation: Secondary | ICD-10-CM | POA: Insufficient documentation

## 2014-07-05 DIAGNOSIS — R05 Cough: Secondary | ICD-10-CM | POA: Diagnosis not present

## 2014-07-05 DIAGNOSIS — Z8619 Personal history of other infectious and parasitic diseases: Secondary | ICD-10-CM | POA: Diagnosis not present

## 2014-07-05 DIAGNOSIS — Z7901 Long term (current) use of anticoagulants: Secondary | ICD-10-CM | POA: Diagnosis not present

## 2014-07-05 DIAGNOSIS — Z79899 Other long term (current) drug therapy: Secondary | ICD-10-CM | POA: Insufficient documentation

## 2014-07-05 DIAGNOSIS — Z8739 Personal history of other diseases of the musculoskeletal system and connective tissue: Secondary | ICD-10-CM | POA: Insufficient documentation

## 2014-07-05 DIAGNOSIS — R059 Cough, unspecified: Secondary | ICD-10-CM

## 2014-07-05 LAB — COMPREHENSIVE METABOLIC PANEL
ALK PHOS: 77 U/L (ref 38–126)
ALT: 16 U/L (ref 14–54)
AST: 23 U/L (ref 15–41)
Albumin: 3.5 g/dL (ref 3.5–5.0)
Anion gap: 10 (ref 5–15)
BUN: 17 mg/dL (ref 6–20)
CALCIUM: 8.7 mg/dL — AB (ref 8.9–10.3)
CO2: 21 mmol/L — AB (ref 22–32)
Chloride: 107 mmol/L (ref 101–111)
Creatinine, Ser: 1.04 mg/dL — ABNORMAL HIGH (ref 0.44–1.00)
GFR calc Af Amer: 53 mL/min — ABNORMAL LOW (ref >60)
GFR calc non Af Amer: 46 mL/min — ABNORMAL LOW (ref >60)
Glucose, Bld: 149 mg/dL — ABNORMAL HIGH (ref 65–99)
Potassium: 3.7 mmol/L (ref 3.5–5.1)
Sodium: 138 mmol/L (ref 135–145)
Total Bilirubin: 1.3 mg/dL — ABNORMAL HIGH (ref 0.3–1.2)
Total Protein: 7.2 g/dL (ref 6.5–8.1)

## 2014-07-05 LAB — CBC WITH DIFFERENTIAL/PLATELET
Basophils Absolute: 0 10*3/uL (ref 0.0–0.1)
Basophils Relative: 0 % (ref 0–1)
EOS ABS: 0 10*3/uL (ref 0.0–0.7)
Eosinophils Relative: 0 % (ref 0–5)
HCT: 42 % (ref 36.0–46.0)
HEMOGLOBIN: 13.9 g/dL (ref 12.0–15.0)
LYMPHS ABS: 0.6 10*3/uL — AB (ref 0.7–4.0)
Lymphocytes Relative: 8 % — ABNORMAL LOW (ref 12–46)
MCH: 32.3 pg (ref 26.0–34.0)
MCHC: 33.1 g/dL (ref 30.0–36.0)
MCV: 97.4 fL (ref 78.0–100.0)
Monocytes Absolute: 0.5 10*3/uL (ref 0.1–1.0)
Monocytes Relative: 6 % (ref 3–12)
NEUTROS ABS: 7.1 10*3/uL (ref 1.7–7.7)
Neutrophils Relative %: 86 % — ABNORMAL HIGH (ref 43–77)
PLATELETS: 188 10*3/uL (ref 150–400)
RBC: 4.31 MIL/uL (ref 3.87–5.11)
RDW: 14.2 % (ref 11.5–15.5)
WBC: 8.2 10*3/uL (ref 4.0–10.5)

## 2014-07-05 LAB — PROTIME-INR
INR: 1.7 — ABNORMAL HIGH (ref 0.00–1.49)
Prothrombin Time: 19.9 seconds — ABNORMAL HIGH (ref 11.6–15.2)

## 2014-07-05 MED ORDER — METOPROLOL TARTRATE 25 MG PO TABS
25.0000 mg | ORAL_TABLET | Freq: Once | ORAL | Status: AC
  Administered 2014-07-05: 25 mg via ORAL
  Filled 2014-07-05: qty 1

## 2014-07-05 MED ORDER — DILTIAZEM HCL ER COATED BEADS 300 MG PO CP24
300.0000 mg | ORAL_CAPSULE | Freq: Once | ORAL | Status: AC
  Administered 2014-07-05: 300 mg via ORAL
  Filled 2014-07-05: qty 1

## 2014-07-05 MED ORDER — ALBUTEROL SULFATE HFA 108 (90 BASE) MCG/ACT IN AERS
2.0000 | INHALATION_SPRAY | RESPIRATORY_TRACT | Status: DC
  Administered 2014-07-05: 2 via RESPIRATORY_TRACT
  Filled 2014-07-05: qty 6.7

## 2014-07-05 NOTE — ED Notes (Signed)
Lab called, Will add on PT INR.

## 2014-07-05 NOTE — ED Notes (Signed)
Patient is in xray.  I will collect labs when she returns. 

## 2014-07-05 NOTE — ED Provider Notes (Signed)
CSN: [id number]     Arrival date & time 07/05/14  1233 History   First MD Initiated Contact with Patient 07/05/14 1506     Chief Complaint  Patient presents with  . Cough     HPI Patient has had a persistent cough for years but reports that her cough is been worsening over the past several days with now thicker sputum.  She denies shortness of breath.  She's had new urinary leakage associated with coughing.  This is disturbing to her.  No fevers or chills.  No upper respiratory symptoms.  No new change in medications.  Patient has a history of Zenker's diverticulum and she's been told in the past that much of her cough is secondary to this.  She also has a history of hiatal hernia.  She has no history of asthma or COPD.  She's never been a smoker.  She has never tried albuterol.  She's never followed up with a pulmonologist.  She is not on an ACE inhibitor.    Past Medical History  Diagnosis Date  . Gout   . Bilateral swelling of feet   . High blood pressure   . Thyroid disease   . E-coli UTI     History of in 11/ 2012 admission  . Dysrhythmia     a-fib  . Atrial fibrillation   . Esophageal diverticulum    Past Surgical History  Procedure Laterality Date  . Breast surgery      benign cyst removed  . Transthoracic echocardiogram  November 2012    EF 60-65%. Normal LV size and function. Mild mitral prolapse and mild regurgitation. Mild LA dilation.   Family History  Problem Relation Age of Onset  . Heart attack Father     He was in his 2860's   History  Substance Use Topics  . Smoking status: Never Smoker   . Smokeless tobacco: Never Used  . Alcohol Use: No   OB History    No data available     Review of Systems  All other systems reviewed and are negative.     Allergies  Zantac  Home Medications   Prior to Admission medications   Medication Sig Start Date End Date Taking? Authorizing Provider  diltiazem (CARDIZEM CD) 300 MG 24 hr capsule Take 1 capsule (300 mg  total) by mouth daily. 12/28/13 06/08/15 Yes Marykay Lexavid W Harding, MD  levothyroxine (SYNTHROID, LEVOTHROID) 75 MCG tablet Take 75 mcg by mouth daily.     Yes Historical Provider, MD  warfarin (COUMADIN) 5 MG tablet Take 2.5-5 mg by mouth See admin instructions. Monday  and Friday she takes a full tablet (5mg ) ,Tuesday, Wednesday, Thursday Saturday and Sunday she takes 1/2 tablet (2.5mg )   Yes Historical Provider, MD  metoprolol tartrate (LOPRESSOR) 25 MG tablet Take 1 tablet (25 mg total) by mouth 2 (two) times daily as needed. Patient not taking: Reported on 07/05/2014 09/26/13   Marykay Lexavid W Harding, MD   BP 101/63 mmHg  Pulse 104  Temp(Src) 99 F (37.2 C) (Oral)  Resp 16  SpO2 93% Physical Exam  Constitutional: She is oriented to person, place, and time. She appears well-developed and well-nourished. No distress.  HENT:  Head: Normocephalic and atraumatic.  Eyes: EOM are normal.  Neck: Normal range of motion.  Cardiovascular: Regular rhythm and normal heart sounds.   Irregularly irregular.  Heart rate control  Pulmonary/Chest: Effort normal and breath sounds normal.  Abdominal: Soft. She exhibits no distension. There is no tenderness.  Musculoskeletal: Normal range of motion.  Neurological: She is alert and oriented to person, place, and time.  Skin: Skin is warm and dry.  Psychiatric: She has a normal mood and affect. Judgment normal.  Nursing note and vitals reviewed.   ED Course  Procedures (including critical care time) Labs Review Labs Reviewed  CBC WITH DIFFERENTIAL/PLATELET - Abnormal; Notable for the following:    Neutrophils Relative % 86 (*)    Lymphocytes Relative 8 (*)    Lymphs Abs 0.6 (*)    All other components within normal limits  COMPREHENSIVE METABOLIC PANEL - Abnormal; Notable for the following:    CO2 21 (*)    Glucose, Bld 149 (*)    Creatinine, Ser 1.04 (*)    Calcium 8.7 (*)    Total Bilirubin 1.3 (*)    GFR calc non Af Amer 46 (*)    GFR calc Af Amer 53 (*)     All other components within normal limits    Imaging Review Dg Chest 2 View  07/05/2014   CLINICAL DATA:  Intermittent cough for 2 weeks, worsened during the past 2 days. History of atrial fibrillation.  EXAM: CHEST  2 VIEW  COMPARISON:  08/26/2013; 03/15/2012; 11/27/2010  FINDINGS: Grossly unchanged enlarged cardiac silhouette with large air and fluid containing retrocardiac opacity compatible with known large hiatal hernia. The lungs remain hyperexpanded with flattening of the bilaterally diaphragms and chronic blunting of the bilateral costophrenic angles. No definite pleural effusion or pneumothorax. No evidence of edema. Grossly unchanged bibasilar opacities, left greater than right, atelectasis versus infiltrate. No new focal airspace opacities. Mild to moderate scoliotic curvature of the thoracolumbar spine with associated multilevel DDD, incompletely evaluated.  IMPRESSION: 1. Hyperexpanded lungs without acute cardiopulmonary disease. Specifically, no definite evidence of edema. 2. Large hiatal hernia.   Electronically Signed   By: Simonne Come M.D.   On: 07/05/2014 13:28  I personally reviewed the imaging tests through PACS system I reviewed available ER/hospitalization records through the EMR    EKG Interpretation   Date/Time:  Wednesday July 05 2014 15:39:22 EDT Ventricular Rate:  132 PR Interval:    QRS Duration: 89 QT Interval:  317 QTC Calculation: 470 R Axis:   -65 Text Interpretation:  Atrial fibrillation Left anterior fascicular block  Low voltage, precordial leads Minimal ST depression, inferior leads No  significant change was found Confirmed by Chisom Aust  MD, Vonita Calloway ([id number]) on  07/05/2014 5:36:59 PM      MDM   Final diagnoses:  Cough  Atrial fibrillation with rapid ventricular response    While emergency department the patient developed atrial fibrillation with rapid ventricular response.  She is on Coumadin.  She has a long-standing history of atrial fib.  It sounds  like her heart rate went up prior to the beta agonist but I do not know that for certain.  I don't think the atrial fib his reason for cough.  She has no signs to suggest congestive heart failure at this time.  I spoke with the patient and patient's son at length and the patient's son would prefer to give her her Cardizem and metoprolol here and he will keep an eye on her heart rate at home.  The patient missed her afternoon Cardizem dose.  She takes Cardizem once a day and always takes in the afternoon.  Is now approximately 5:30 PM.  I've asked that patient son to call the cardiologist in the morning of her heart rate remains elevated.  He  states he has waited a monitor at home and he would prefer to monitor it.  The patient would like to go home at this time.  They understand to return to the emergency department for any new or worsening symptoms.  This may represent bronchitis.  No pneumonia on chest x-ray.  Discharge home in good condition.    Azalia Bilis, MD 07/06/14 2142

## 2014-07-05 NOTE — ED Notes (Addendum)
Pt states that he cough has been worse then normal since Monday and hasnt been able to get a good night rest due to cough.  Visitor with pt states that "cough is so bad that she will wet her pants sometimes".  Pt states that she is getting up clear phlegm.  Pt has diverticulum and normally has a cough per family member.

## 2014-07-05 NOTE — Discharge Instructions (Signed)
Cough, Adult  A cough is a reflex that helps clear your throat and airways. It can help heal the body or may be a reaction to an irritated airway. A cough may only last 2 or 3 weeks (acute) or may last more than 8 weeks (chronic).  CAUSES Acute cough:  Viral or bacterial infections. Chronic cough:  Infections.  Allergies.  Asthma.  Post-nasal drip.  Smoking.  Heartburn or acid reflux.  Some medicines.  Chronic lung problems (COPD).  Cancer. SYMPTOMS   Cough.  Fever.  Chest pain.  Increased breathing rate.  High-pitched whistling sound when breathing (wheezing).  Colored mucus that you cough up (sputum). TREATMENT   A bacterial cough may be treated with antibiotic medicine.  A viral cough must run its course and will not respond to antibiotics.  Your caregiver may recommend other treatments if you have a chronic cough. HOME CARE INSTRUCTIONS   Only take over-the-counter or prescription medicines for pain, discomfort, or fever as directed by your caregiver. Use cough suppressants only as directed by your caregiver.  Use a cold steam vaporizer or humidifier in your bedroom or home to help loosen secretions.  Sleep in a semi-upright position if your cough is worse at night.  Rest as needed.  Stop smoking if you smoke. SEEK IMMEDIATE MEDICAL CARE IF:   You have pus in your sputum.  Your cough starts to worsen.  You cannot control your cough with suppressants and are losing sleep.  You begin coughing up blood.  You have difficulty breathing.  You develop pain which is getting worse or is uncontrolled with medicine.  You have a fever. MAKE SURE YOU:   Understand these instructions.  Will watch your condition.  Will get help right away if you are not doing well or get worse. Document Released: 07/12/2010 Document Revised: 04/07/2011 Document Reviewed: 07/12/2010 Exit[employer] Patient Information 2015 [employer]. This information is not intended  to replace advice given to you by your health care provider. Make sure you discuss any questions you have with your health care provider.  

## 2014-07-24 DIAGNOSIS — E039 Hypothyroidism, unspecified: Secondary | ICD-10-CM | POA: Diagnosis not present

## 2014-07-24 DIAGNOSIS — Z1389 Encounter for screening for other disorder: Secondary | ICD-10-CM | POA: Diagnosis not present

## 2014-07-24 DIAGNOSIS — K225 Diverticulum of esophagus, acquired: Secondary | ICD-10-CM | POA: Diagnosis not present

## 2014-07-24 DIAGNOSIS — Z7901 Long term (current) use of anticoagulants: Secondary | ICD-10-CM | POA: Diagnosis not present

## 2014-07-24 DIAGNOSIS — I480 Paroxysmal atrial fibrillation: Secondary | ICD-10-CM | POA: Diagnosis not present

## 2014-07-24 DIAGNOSIS — M109 Gout, unspecified: Secondary | ICD-10-CM | POA: Diagnosis not present

## 2014-07-24 DIAGNOSIS — Z6828 Body mass index (BMI) 28.0-28.9, adult: Secondary | ICD-10-CM | POA: Diagnosis not present

## 2014-07-24 DIAGNOSIS — I10 Essential (primary) hypertension: Secondary | ICD-10-CM | POA: Diagnosis not present

## 2014-08-24 DIAGNOSIS — Z7901 Long term (current) use of anticoagulants: Secondary | ICD-10-CM | POA: Diagnosis not present

## 2014-08-24 DIAGNOSIS — I480 Paroxysmal atrial fibrillation: Secondary | ICD-10-CM | POA: Diagnosis not present

## 2014-09-28 DIAGNOSIS — Z7901 Long term (current) use of anticoagulants: Secondary | ICD-10-CM | POA: Diagnosis not present

## 2014-09-28 DIAGNOSIS — I480 Paroxysmal atrial fibrillation: Secondary | ICD-10-CM | POA: Diagnosis not present

## 2014-11-01 DIAGNOSIS — I480 Paroxysmal atrial fibrillation: Secondary | ICD-10-CM | POA: Diagnosis not present

## 2014-11-01 DIAGNOSIS — Z7901 Long term (current) use of anticoagulants: Secondary | ICD-10-CM | POA: Diagnosis not present

## 2014-11-09 DIAGNOSIS — E559 Vitamin D deficiency, unspecified: Secondary | ICD-10-CM | POA: Diagnosis not present

## 2014-11-09 DIAGNOSIS — M84375A Stress fracture, left foot, initial encounter for fracture: Secondary | ICD-10-CM | POA: Diagnosis not present

## 2014-11-16 DIAGNOSIS — M84375D Stress fracture, left foot, subsequent encounter for fracture with routine healing: Secondary | ICD-10-CM | POA: Diagnosis not present

## 2014-12-05 DIAGNOSIS — I480 Paroxysmal atrial fibrillation: Secondary | ICD-10-CM | POA: Diagnosis not present

## 2014-12-05 DIAGNOSIS — Z7901 Long term (current) use of anticoagulants: Secondary | ICD-10-CM | POA: Diagnosis not present

## 2015-01-05 DIAGNOSIS — I480 Paroxysmal atrial fibrillation: Secondary | ICD-10-CM | POA: Diagnosis not present

## 2015-01-05 DIAGNOSIS — Z7901 Long term (current) use of anticoagulants: Secondary | ICD-10-CM | POA: Diagnosis not present

## 2016-07-18 ENCOUNTER — Encounter: Payer: Self-pay | Admitting: Podiatry

## 2016-07-18 ENCOUNTER — Ambulatory Visit (INDEPENDENT_AMBULATORY_CARE_PROVIDER_SITE_OTHER): Payer: Medicare Other | Admitting: Podiatry

## 2016-07-18 DIAGNOSIS — Q828 Other specified congenital malformations of skin: Secondary | ICD-10-CM

## 2016-07-18 DIAGNOSIS — M79672 Pain in left foot: Secondary | ICD-10-CM | POA: Diagnosis not present

## 2016-07-18 DIAGNOSIS — B351 Tinea unguium: Secondary | ICD-10-CM | POA: Diagnosis not present

## 2016-07-18 DIAGNOSIS — M79674 Pain in right toe(s): Secondary | ICD-10-CM | POA: Diagnosis not present

## 2016-07-18 DIAGNOSIS — M79675 Pain in left toe(s): Secondary | ICD-10-CM

## 2016-07-18 NOTE — Progress Notes (Signed)
   Subjective:    Patient ID: Arvid RightRuby S Winton, female    DOB: 01/12/1922, 83 y.o.   MRN: [id number]  HPI Ms. Rodenberg presents to the office today for concerns of painful callus to the ball of the left foot which is a chronic issue. She states that she has to keep a Band-Aid on the area to help pad. She denies any redness or drainage or any swelling. She also states that her nails are thick, elongated and she cannot trim there herself. She occasionally gets pedicures. She denies any redness, drainage or swelling to the toenails. No other concerns.   Review of Systems  Musculoskeletal: Positive for gait problem.  All other systems reviewed and are negative.      Objective:   Physical Exam General: AAO x3, NAD  Dermatological: Nails are hypertrophic, dystrophic, brittle, discolored, elongated 10. No surrounding redness or drainage. Tenderness nails 1-5 bilaterally.hyperkeratotic lesion left foot submetatarsal. Upon debridement no ulceration, drainage or clinical signs of infection. No open lesions or pre-ulcerative lesions are identified today.  Vascular: Dorsalis Pedis artery and Posterior Tibial artery pedal pulses are 2/4 bilateral with immedate capillary fill time. There is no pain with calf compression, swelling, warmth, erythema.   Neruologic: Grossly intact via light touch bilateral. Vibratory intact via tuning fork bilateral. Protective threshold with Semmes Wienstein monofilament intact to all pedal sites bilateral.   Musculoskeletal: Prominent metatarsal heads. No area of pinpoint tenderness. No gross boney pedal deformities bilateral. No pain, crepitus, or limitation noted with foot and ankle range of motion bilateral. Muscular strength 5/5 in all groups tested bilateral.    Assessment & Plan:  83 year old female with symptomatic onychomycosis, hyperkearatotic lesion -Treatment options discussed including all alternatives, risks, and complications -Etiology of symptoms were  discussed -Nails debrided 10 without complications or bleeding. -Hyperkeratotic lesion sharply debrided x 1 without complications or bleeding.  -Daily foot inspection -Follow-up in 3 months or sooner if any problems arise. In the meantime, encouraged to call the office with any questions, concerns, change in symptoms.   Ovid CurdMatthew Cena Bruhn, DPM

## 2016-10-17 ENCOUNTER — Emergency Department (HOSPITAL_BASED_OUTPATIENT_CLINIC_OR_DEPARTMENT_OTHER)
Admission: EM | Admit: 2016-10-17 | Discharge: 2016-10-17 | Disposition: A | Discharge status: Home / Self Care | Payer: Medicare Other | Attending: Emergency Medicine | Admitting: Emergency Medicine

## 2016-10-17 ENCOUNTER — Encounter (HOSPITAL_BASED_OUTPATIENT_CLINIC_OR_DEPARTMENT_OTHER): Payer: Self-pay

## 2016-10-17 ENCOUNTER — Ambulatory Visit: Payer: Medicare Other | Admitting: Podiatry

## 2016-10-17 ENCOUNTER — Emergency Department (HOSPITAL_BASED_OUTPATIENT_CLINIC_OR_DEPARTMENT_OTHER): Payer: Medicare Other

## 2016-10-17 DIAGNOSIS — Z5321 Procedure and treatment not carried out due to patient leaving prior to being seen by health care provider: Secondary | ICD-10-CM | POA: Diagnosis not present

## 2016-10-17 DIAGNOSIS — M25561 Pain in right knee: Secondary | ICD-10-CM | POA: Insufficient documentation

## 2016-10-17 NOTE — ED Triage Notes (Signed)
Fall onto right knee last week. Pain with standing since.

## 2017-01-14 ENCOUNTER — Encounter (HOSPITAL_COMMUNITY): Payer: Self-pay

## 2017-01-14 ENCOUNTER — Other Ambulatory Visit: Payer: Self-pay

## 2017-01-14 ENCOUNTER — Emergency Department (HOSPITAL_COMMUNITY)
Admission: EM | Admit: 2017-01-14 | Discharge: 2017-01-15 | Disposition: A | Discharge status: Home / Self Care | Payer: Medicare Other | Attending: Emergency Medicine | Admitting: Emergency Medicine

## 2017-01-14 DIAGNOSIS — Z5321 Procedure and treatment not carried out due to patient leaving prior to being seen by health care provider: Secondary | ICD-10-CM | POA: Insufficient documentation

## 2017-01-14 DIAGNOSIS — R52 Pain, unspecified: Secondary | ICD-10-CM | POA: Diagnosis present

## 2017-01-14 NOTE — ED Triage Notes (Signed)
Pt BIB son c/o generalized body soreness. She states that she has pain all over that is making it hard for her to ambulate. She reports a fall 1 week ago. She does not remember falling or how long she was down, but denies hitting head. She wants to be evaluated because her body pain does not seem to be getting better. Pt is on warfarin and cardizem. A&Ox4.

## 2017-01-24 ENCOUNTER — Emergency Department (HOSPITAL_COMMUNITY): Payer: Medicare Other

## 2017-01-24 ENCOUNTER — Other Ambulatory Visit: Payer: Self-pay

## 2017-01-24 ENCOUNTER — Encounter (HOSPITAL_COMMUNITY): Payer: Self-pay | Admitting: Emergency Medicine

## 2017-01-24 ENCOUNTER — Emergency Department (HOSPITAL_COMMUNITY)
Admission: EM | Admit: 2017-01-24 | Discharge: 2017-01-24 | Disposition: A | Discharge status: Home / Self Care | Payer: Medicare Other | Attending: Emergency Medicine | Admitting: Emergency Medicine

## 2017-01-24 DIAGNOSIS — Y92002 Bathroom of unspecified non-institutional (private) residence single-family (private) house as the place of occurrence of the external cause: Secondary | ICD-10-CM | POA: Insufficient documentation

## 2017-01-24 DIAGNOSIS — Y999 Unspecified external cause status: Secondary | ICD-10-CM | POA: Diagnosis not present

## 2017-01-24 DIAGNOSIS — I10 Essential (primary) hypertension: Secondary | ICD-10-CM | POA: Diagnosis not present

## 2017-01-24 DIAGNOSIS — Z79899 Other long term (current) drug therapy: Secondary | ICD-10-CM | POA: Insufficient documentation

## 2017-01-24 DIAGNOSIS — Y939 Activity, unspecified: Secondary | ICD-10-CM | POA: Insufficient documentation

## 2017-01-24 DIAGNOSIS — S299XXA Unspecified injury of thorax, initial encounter: Secondary | ICD-10-CM | POA: Diagnosis present

## 2017-01-24 DIAGNOSIS — W19XXXA Unspecified fall, initial encounter: Secondary | ICD-10-CM | POA: Diagnosis not present

## 2017-01-24 DIAGNOSIS — S22080A Wedge compression fracture of T11-T12 vertebra, initial encounter for closed fracture: Secondary | ICD-10-CM | POA: Diagnosis not present

## 2017-01-24 DIAGNOSIS — E039 Hypothyroidism, unspecified: Secondary | ICD-10-CM | POA: Diagnosis not present

## 2017-01-24 LAB — PROTIME-INR
INR: 3.04
PROTHROMBIN TIME: 31.2 s — AB (ref 11.4–15.2)

## 2017-01-24 NOTE — ED Provider Notes (Signed)
[facility]-EMERGENCY DEPT Provider Note   CSN: [id number] Arrival date & time: 01/24/17  0841     History   Chief Complaint Chief Complaint  Patient presents with  . Back Pain  . Fall    HPI Carrie Morris is a 83 y.o. female.  HPI  83 year old female with a history of atrial fibrillation presents with low back pain since a fall 2 weeks ago.  She fell in the bathroom.  She had trouble getting back up and had to crawl.  At that point she had diffuse body pain that she attributed to having to use the muscles to crawl along the floor.  Since then all of the pain except for her low back is resolved.  The pain is about a 5/10.  It is most prominent when she lays on her abdomen or when she gets up and walks.  She is able to ambulate like at baseline with her walker.  She has a wheelchair at home as needed.  She denies any urinary symptoms or any new urine.  There is no radiation of the pain, weakness, or numbness in her extremities.  No fecal incontinence or saddle anesthesia.  Has tried exercising Tylenol, ibuprofen, and Voltaren cream with moderate relief.  No fevers.  The pain is lumbar and left-sided.  Past Medical History:  Diagnosis Date  . Atrial fibrillation (HCC)   . Bilateral swelling of feet   . Dysrhythmia    a-fib  . E-coli UTI    History of in 11/ 2012 admission  . Esophageal diverticulum   . Gout   . High blood pressure   . Thyroid disease     Patient Active Problem List   Diagnosis Date Noted  . Chest pain 08/26/2013  . Zenker diverticulum 03/16/2012  . Essential hypertension 03/14/2012  . Chronic atrial fibrillation (HCC) 03/14/2012  . Hypothyroidism 03/14/2012    Past Surgical History:  Procedure Laterality Date  . BREAST SURGERY     benign cyst removed  . TRANSTHORACIC ECHOCARDIOGRAM  November 2012   EF 60-65%. Normal LV size and function. Mild mitral prolapse and mild regurgitation. Mild LA dilation.    OB History    No data  available       Home Medications    Prior to Admission medications   Medication Sig Start Date End Date Taking? Authorizing Provider  diclofenac sodium (VOLTAREN) 1 % GEL Apply 4 g topically daily.   Yes [provider]  diltiazem (CARDIZEM CD) 300 MG 24 hr capsule Take 1 capsule (300 mg total) by mouth daily. 12/28/13 01/24/17 Yes Marykay LexHarding, David W, MD  levothyroxine (SYNTHROID, LEVOTHROID) 75 MCG tablet Take 75 mcg by mouth daily.     Yes [provider]  warfarin (COUMADIN) 5 MG tablet Take 2.5-5 mg by mouth See admin instructions. Monday  and Wednesday, she takes a full tablet (5mg ) ,Tuesday, Thursday, Friday, Saturday and Sunday she takes 1/2 tablet (2.5mg )   Yes [provider]    Family History Family History  Problem Relation Age of Onset  . Heart attack Father        He was in his 6160's    Social History Social History   Tobacco Use  . Smoking status: Never Smoker  . Smokeless tobacco: Never Used  Substance Use Topics  . Alcohol use: No  . Drug use: No     Allergies   Zantac [ranitidine hcl]   Review of Systems Review of Systems  Constitutional: Negative for fever.  Genitourinary: Negative for dysuria.  Musculoskeletal: Positive for back pain. Negative for gait problem.  Neurological: Negative for weakness and numbness.  All other systems reviewed and are negative.    Physical Exam Updated Vital Signs BP (!) 126/55 (BP Location: Right Arm)   Pulse 63   Temp (!) 97.5 F (36.4 C) (Oral)   Resp 18   Wt 57.6 kg (127 lb)   SpO2 96%   BMI 24.00 kg/m   Physical Exam  Constitutional: She is oriented to person, place, and time. She appears well-developed and well-nourished.  HENT:  Head: Normocephalic and atraumatic.  Right Ear: External ear normal.  Left Ear: External ear normal.  Nose: Nose normal.  Eyes: Right eye exhibits no discharge. Left eye exhibits no discharge.  Cardiovascular: Normal rate, regular rhythm and normal  heart sounds.  Pulmonary/Chest: Effort normal and breath sounds normal.  Abdominal: Soft. She exhibits no distension. There is no tenderness.  Musculoskeletal:       Right hip: She exhibits normal range of motion and no tenderness.       Left hip: She exhibits normal range of motion and no tenderness.       Lumbar back: She exhibits tenderness.       Back:  Tenderness over midline lumbar back and left of midline.  Normal ROM of hips, no tenderness  Neurological: She is alert and oriented to person, place, and time.  5/5 strength in BLE. Normal gross sensation  Skin: Skin is warm and dry.  Nursing note and vitals reviewed.    ED Treatments / Results  Labs (all labs ordered are listed, but only abnormal results are displayed) Labs Reviewed  PROTIME-INR - Abnormal; Notable for the following components:      Result Value   Prothrombin Time 31.2 (*)    All other components within normal limits    EKG  EKG Interpretation None       Radiology Ct Lumbar Spine Wo Contrast  Result Date: 01/24/2017 CLINICAL DATA:  Fall 2 weeks ago with persistent low back pain. EXAM: CT LUMBAR SPINE WITHOUT CONTRAST TECHNIQUE: Multidetector CT imaging of the lumbar spine was performed without intravenous contrast administration. Multiplanar CT image reconstructions were also generated. COMPARISON:  CT abdomen and pelvis 11/27/2010 FINDINGS: Segmentation: 5 non rib-bearing lumbar type vertebral bodies are present. Alignment: Retrolisthesis at L1-2 measures 7 mm. Retrolisthesis at L2-3 measures 6 mm. This is worse on the left. Slight anterolisthesis is present at L5-S1. Levoconvex curvature is centered at L2. Vertebrae: Endplate sclerotic changes are again noted on the right greater than left at L1-2 and L2-3. Acute/subacute superior endplate fractures present at T12 with approximately 40% loss of height anteriorly. There is no significant retropulsed bone. Paraspinal and other soft tissues: Subtle soft  tissue changes at T12 likely represent a paravertebral hematoma. Extensive atherosclerotic calcifications are present in the aorta without aneurysm. Disc levels: T11-12: Moderate facet hypertrophy there is present bilaterally. Disc bulging is noted. Mild foraminal narrowing is present bilaterally. T12-L1: Broad-based disc bulge and mild facet hypertrophy are present without significant stenosis. L1-2: Osseous foraminal stenosis is present on the right. The left foramen is patent. L2-3: Moderate right foraminal narrowing is present. The left foramen is patent. L3-4: A broad-based disc protrusion is present. Moderate facet hypertrophy is noted bilaterally. L4-5: Moderate facet hypertrophy is worse on the right. There is no significant stenosis. L5-S1: Advanced facet hypertrophy is worse on the left. There is uncovering of a broad-based  disc protrusion. Moderate foraminal narrowing is present bilaterally. IMPRESSION: 1. Acute/subacute superior endplate compression fracture at T12 with 40% loss of height but no significant retropulsed bone. 2. Levoconvex scoliosis centered at L2 with asymmetric right-sided disease at L1-2 and L2-3. 3. Stable retrolisthesis at L1-2 and L2-3. 4.  Aortic Atherosclerosis (ICD10-I70.0). Electronically Signed   By: Marin Robertshristopher  Mattern M.D.   On: 01/24/2017 12:06    Procedures Procedures (including critical care time)  Medications Ordered in ED Medications - No data to display   Initial Impression / Assessment and Plan / ED Course  I have reviewed the triage vital signs and the nursing notes.  Pertinent labs & imaging results that were available during my care of the patient were reviewed by me and considered in my medical decision making (see chart for details).     Patient is neurologically intact.  She has a T12 compression fracture as seen on the CT scan.  Likely this is causing her pain.  Her left-sided pain is probably muscular.  There are no radicular symptoms.  Her  INR is therapeutic.  No signs or symptoms of bleeding.  She declines an increase in pain medicine and would like to continue Tylenol and Voltaren gel.  Given that her pain seems to be moderately controlled with this I think this is reasonable.  I offered her outpatient referrals for possible treatment of this but she declines stating she just wants to follow-up with her PCP as scheduled on 02/03/17.  Return precautions.  Final Clinical Impressions(s) / ED Diagnoses   Final diagnoses:  T12 compression fracture [facility](HCC)    ED Discharge Orders    None       Pricilla LovelessGoldston, Drue Harr, MD 01/24/17 [phone]

## 2017-01-24 NOTE — ED Triage Notes (Signed)
Pt stated that she fell two weeks ago and has still  low back pain, unresponsive to Voltaren cream. Pt/son stated that she may have slipped and fallen while in bathroom and attempted to crawl to bedroom. Pt is taking Warfarin. Pt denies LOC, denies striking head. Pt has not informed PCP of incident.

## 2019-08-21 ENCOUNTER — Emergency Department (HOSPITAL_COMMUNITY): Payer: Medicare Other

## 2019-08-21 ENCOUNTER — Inpatient Hospital Stay (HOSPITAL_COMMUNITY)
Admission: EM | Admit: 2019-08-21 | Discharge: 2019-08-28 | DRG: 871 | Disposition: E | Payer: Medicare Other | Attending: Internal Medicine | Admitting: Internal Medicine

## 2019-08-21 DIAGNOSIS — Z8249 Family history of ischemic heart disease and other diseases of the circulatory system: Secondary | ICD-10-CM

## 2019-08-21 DIAGNOSIS — G9340 Encephalopathy, unspecified: Secondary | ICD-10-CM | POA: Diagnosis not present

## 2019-08-21 DIAGNOSIS — Z7901 Long term (current) use of anticoagulants: Secondary | ICD-10-CM

## 2019-08-21 DIAGNOSIS — K5900 Constipation, unspecified: Secondary | ICD-10-CM | POA: Diagnosis present

## 2019-08-21 DIAGNOSIS — R64 Cachexia: Secondary | ICD-10-CM | POA: Diagnosis present

## 2019-08-21 DIAGNOSIS — Z888 Allergy status to other drugs, medicaments and biological substances status: Secondary | ICD-10-CM

## 2019-08-21 DIAGNOSIS — E872 Acidosis, unspecified: Secondary | ICD-10-CM | POA: Diagnosis present

## 2019-08-21 DIAGNOSIS — R7401 Elevation of levels of liver transaminase levels: Secondary | ICD-10-CM | POA: Diagnosis present

## 2019-08-21 DIAGNOSIS — G9341 Metabolic encephalopathy: Secondary | ICD-10-CM | POA: Diagnosis present

## 2019-08-21 DIAGNOSIS — R54 Age-related physical debility: Secondary | ICD-10-CM | POA: Diagnosis present

## 2019-08-21 DIAGNOSIS — I482 Chronic atrial fibrillation, unspecified: Secondary | ICD-10-CM | POA: Diagnosis present

## 2019-08-21 DIAGNOSIS — A419 Sepsis, unspecified organism: Principal | ICD-10-CM | POA: Diagnosis present

## 2019-08-21 DIAGNOSIS — N289 Disorder of kidney and ureter, unspecified: Secondary | ICD-10-CM | POA: Diagnosis present

## 2019-08-21 DIAGNOSIS — N390 Urinary tract infection, site not specified: Secondary | ICD-10-CM | POA: Diagnosis present

## 2019-08-21 DIAGNOSIS — Z6820 Body mass index (BMI) 20.0-20.9, adult: Secondary | ICD-10-CM

## 2019-08-21 DIAGNOSIS — E039 Hypothyroidism, unspecified: Secondary | ICD-10-CM | POA: Diagnosis present

## 2019-08-21 DIAGNOSIS — Z79890 Hormone replacement therapy (postmenopausal): Secondary | ICD-10-CM

## 2019-08-21 DIAGNOSIS — J90 Pleural effusion, not elsewhere classified: Secondary | ICD-10-CM | POA: Diagnosis present

## 2019-08-21 DIAGNOSIS — R627 Adult failure to thrive: Secondary | ICD-10-CM | POA: Diagnosis present

## 2019-08-21 DIAGNOSIS — I129 Hypertensive chronic kidney disease with stage 1 through stage 4 chronic kidney disease, or unspecified chronic kidney disease: Secondary | ICD-10-CM | POA: Diagnosis present

## 2019-08-21 DIAGNOSIS — N179 Acute kidney failure, unspecified: Secondary | ICD-10-CM | POA: Diagnosis present

## 2019-08-21 DIAGNOSIS — J9811 Atelectasis: Secondary | ICD-10-CM | POA: Diagnosis present

## 2019-08-21 DIAGNOSIS — E079 Disorder of thyroid, unspecified: Secondary | ICD-10-CM | POA: Diagnosis present

## 2019-08-21 DIAGNOSIS — R6520 Severe sepsis without septic shock: Secondary | ICD-10-CM

## 2019-08-21 DIAGNOSIS — Z515 Encounter for palliative care: Secondary | ICD-10-CM | POA: Diagnosis not present

## 2019-08-21 DIAGNOSIS — R6521 Severe sepsis with septic shock: Secondary | ICD-10-CM | POA: Diagnosis present

## 2019-08-21 DIAGNOSIS — N1831 Chronic kidney disease, stage 3a: Secondary | ICD-10-CM | POA: Diagnosis present

## 2019-08-21 DIAGNOSIS — Z20822 Contact with and (suspected) exposure to covid-19: Secondary | ICD-10-CM | POA: Diagnosis present

## 2019-08-21 DIAGNOSIS — E43 Unspecified severe protein-calorie malnutrition: Secondary | ICD-10-CM | POA: Diagnosis present

## 2019-08-21 DIAGNOSIS — Z7401 Bed confinement status: Secondary | ICD-10-CM

## 2019-08-21 DIAGNOSIS — K5641 Fecal impaction: Secondary | ICD-10-CM | POA: Diagnosis present

## 2019-08-21 DIAGNOSIS — Z66 Do not resuscitate: Secondary | ICD-10-CM | POA: Diagnosis not present

## 2019-08-21 DIAGNOSIS — I4891 Unspecified atrial fibrillation: Secondary | ICD-10-CM

## 2019-08-21 LAB — URINALYSIS, ROUTINE W REFLEX MICROSCOPIC
Glucose, UA: NEGATIVE mg/dL
Ketones, ur: 5 mg/dL — AB
Nitrite: NEGATIVE
Protein, ur: 100 mg/dL — AB
Specific Gravity, Urine: 1.019 (ref 1.005–1.030)
WBC, UA: >50 WBC/hpf — ABNORMAL HIGH (ref 0–5)
pH: 5 (ref 5.0–8.0)

## 2019-08-21 MED ORDER — SODIUM CHLORIDE 0.9 % IV SOLN
1.0000 g | Freq: Once | INTRAVENOUS | Status: AC
  Administered 2019-08-21: 1 g via INTRAVENOUS
  Filled 2019-08-21: qty 10

## 2019-08-21 MED ORDER — LACTATED RINGERS IV BOLUS (SEPSIS)
500.0000 mL | Freq: Once | INTRAVENOUS | Status: AC
  Administered 2019-08-21: 500 mL via INTRAVENOUS

## 2019-08-21 NOTE — ED Triage Notes (Signed)
Pt presents from home with caregiver son for afib after not taking home cardizem x2-3 days while poor intake. H/o afib.   EMS arrived to GCS 8, 88/40, Afib 160 bpm, CBG 70. 500cc NS and 150 cc D10 given. No change.  Cardioverted x2 (200J, then 300J), rate reduced from 160 to 110 at second cardioversion. BP117/74 at that time, no ST elevation noted, GCS improved to 14 (baseline).  Son on the way.

## 2019-08-21 NOTE — ED Provider Notes (Signed)
[facility] EMERGENCY DEPARTMENT Provider Note   CSN: [id number] Arrival date & time: 09-04-19  2147     History Chief Complaint  Patient presents with  . Atrial Fibrillation    Carrie Morris is a 86 y.o. female.  HPI   Patient presents to the ED for evaluation of poor p.o. intake, tachycardia, lethargy and declining health.  Patient resides at home with her son.  At baseline he usually has to move her in a wheelchair when she has to go to the bathroom or to help feed her.  She is usually able to carry on a conversation.  Son has noticed in the last couple of weeks however her health has been declining.  She has not been eating or drinking as much.  In the last couple days she has not really had much if anything to eat or drink at all.  She has been not speaking clearly.  He has been moaning and appears to be uncomfortable.  No known fevers.  No vomiting or diarrhea.  Son called EMS when they arrived they noted she was in rapid A. fib.  They cardioverted her on route with 200 then 300 joules.  Her rate decreased however she remained in atrial fibrillation and did not convert  Past Medical History:  Diagnosis Date  . Atrial fibrillation (HCC)   . Bilateral swelling of feet   . Dysrhythmia    a-fib  . E-coli UTI    History of in 11/ 2012 admission  . Esophageal diverticulum   . Gout   . High blood pressure   . Thyroid disease     Patient Active Problem List   Diagnosis Date Noted  . Chest pain 08/26/2013  . Zenker diverticulum 03/16/2012  . Essential hypertension 03/14/2012  . Chronic atrial fibrillation (HCC) 03/14/2012  . Hypothyroidism 03/14/2012    Past Surgical History:  Procedure Laterality Date  . BREAST SURGERY     benign cyst removed  . TRANSTHORACIC ECHOCARDIOGRAM  November 2012   EF 60-65%. Normal LV size and function. Mild mitral prolapse and mild regurgitation. Mild LA dilation.     OB History   No obstetric history on file.      Family History  Problem Relation Age of Onset  . Heart attack Father        He was in his 73's    Social History   Tobacco Use  . Smoking status: Never Smoker  . Smokeless tobacco: Never Used  Substance Use Topics  . Alcohol use: No  . Drug use: No    Home Medications Prior to Admission medications   Medication Sig Start Date End Date Taking? Authorizing Provider  diclofenac sodium (VOLTAREN) 1 % GEL Apply 4 g topically daily.    [provider]  diltiazem (CARDIZEM CD) 300 MG 24 hr capsule Take 1 capsule (300 mg total) by mouth daily. 12/28/13 01/24/17  Marykay Lex, MD  levothyroxine (SYNTHROID, LEVOTHROID) 75 MCG tablet Take 75 mcg by mouth daily.      [provider]  warfarin (COUMADIN) 5 MG tablet Take 2.5-5 mg by mouth See admin instructions. Monday  and Wednesday, she takes a full tablet (5mg ) ,Tuesday, Thursday, Friday, Saturday and Sunday she takes 1/2 tablet (2.5mg )    [provider]    Allergies    Zantac [ranitidine hcl]  Review of Systems   Review of Systems  All other systems reviewed and are negative.   Physical Exam Updated Vital  Signs BP (!) 104/63   Pulse 97   Temp 98.8 F (37.1 C) (Axillary)   Resp (!) 29   SpO2 98%   Physical Exam Vitals and nursing note reviewed.  Constitutional:      Appearance: She is well-developed. She is ill-appearing.     Comments: Elderly frail, listless  HENT:     Head: Normocephalic and atraumatic.     Right Ear: External ear normal.     Left Ear: External ear normal.     Mouth/Throat:     Comments: Mucous membranes appear dry Eyes:     General: No scleral icterus.       Right eye: No discharge.        Left eye: No discharge.     Conjunctiva/sclera: Conjunctivae normal.  Neck:     Trachea: No tracheal deviation.  Cardiovascular:     Rate and Rhythm: Tachycardia present. Rhythm irregular.  Pulmonary:     Effort: Pulmonary effort is normal. No respiratory distress.      Breath sounds: Normal breath sounds. No stridor. No wheezing or rales.  Abdominal:     General: Bowel sounds are normal. There is no distension.     Palpations: Abdomen is soft.     Tenderness: There is no abdominal tenderness. There is no guarding or rebound.  Musculoskeletal:        General: No swelling or tenderness.     Cervical back: Neck supple.  Skin:    General: Skin is warm and dry.     Findings: No rash.  Neurological:     Cranial Nerves: No cranial nerve deficit (no facial droop, e ).     Motor: No abnormal muscle tone or seizure activity.     Comments: Patient does not follow commands, she did respond to me verbally initially when I spoke to her, she is not able answer questions or provide any other history her speech is not intelligible, patient will not lift her arms or legs when asked     ED Results / Procedures / Treatments   Labs (all labs ordered are listed, but only abnormal results are displayed) Labs Reviewed  CULTURE, BLOOD (ROUTINE X 2)  CULTURE, BLOOD (ROUTINE X 2)  URINE CULTURE  LACTIC ACID, PLASMA  LACTIC ACID, PLASMA  COMPREHENSIVE METABOLIC PANEL  CBC WITH DIFFERENTIAL/PLATELET  APTT  PROTIME-INR  URINALYSIS, ROUTINE W REFLEX MICROSCOPIC  BRAIN NATRIURETIC PEPTIDE  TROPONIN I (HIGH SENSITIVITY)    EKG EKG Interpretation  Date/Time:  Sunday Sep 02, 2019 22:01:11 EDT Ventricular Rate:  140 PR Interval:    QRS Duration: 83 QT Interval:  295 QTC Calculation: 421 R Axis:   -60 Text Interpretation: Atrial fibrillation Left anterior fascicular block Low voltage, extremity and precordial leads Consider anterior infarct Nonspecific T abnormalities, lateral leads No significant change since last tracing Confirmed by Linwood Dibbles [phone]) on Sep 02, 2019 11:26:03 PM   Radiology DG Chest Port 1 View  Result Date: Sep 02, 2019 CLINICAL DATA:  86 year old female with weakness. EXAM: PORTABLE CHEST 1 VIEW COMPARISON:  Chest radiograph dated 07/05/2014.  FINDINGS: Probable trace bilateral pleural effusions and bibasilar atelectasis. No lobar consolidation or pneumothorax. Stable cardiomegaly. Atherosclerotic calcification of the aorta. The previously seen hiatal hernia is less conspicuous on today's exam. Osteopenia. No acute osseous pathology. IMPRESSION: 1. Probable trace bilateral pleural effusions and bibasilar atelectasis. 2. Stable cardiomegaly. Electronically Signed   By: Elgie Collard M.D.   On: 2019-09-02 22:54    Procedures Procedures (including critical care time)  Medications Ordered in ED Medications  lactated ringers bolus 500 mL (500 mLs Intravenous New Bag/Given 09-18-2019 2319)  cefTRIAXone (ROCEPHIN) 1 g in sodium chloride 0.9 % 100 mL IVPB (1 g Intravenous New Bag/Given 2019-09-18 2320)    ED Course  I have reviewed the triage vital signs and the nursing notes.  Pertinent labs & imaging results that were available during my care of the patient were reviewed by me and considered in my medical decision making (see chart for details).  Clinical Course as of Aug 20 2324  Wynelle Link September 18, 2019  2326 Pulse rate down to 118 at bedside.  Will hold off on a Cardizem right now   [JK]    Clinical Course User Index [JK] Linwood Dibbles, MD   MDM Rules/Calculators/A&P                          Pt presents with altered mental status, rapid a fib, poor po intake.  A fib currently mildy tachycardic.  Will monitor.  Sx concerning for possible sepsis, aki, stroke.  Sepsis is a possibility but no infection noted at this time and afebrile. Will check labs. Head ct ordered.  Empiric rocephin given.  Care turned over to Dr Eudelia Bunch at shift change to follow up on results.  Anticipate hospital admission Final Clinical Impression(s) / ED Diagnoses pending   Linwood Dibbles, MD 08/22/19 0001

## 2019-08-22 ENCOUNTER — Emergency Department (HOSPITAL_COMMUNITY): Payer: Medicare Other

## 2019-08-22 DIAGNOSIS — N179 Acute kidney failure, unspecified: Secondary | ICD-10-CM | POA: Diagnosis present

## 2019-08-22 DIAGNOSIS — E872 Acidosis, unspecified: Secondary | ICD-10-CM | POA: Diagnosis present

## 2019-08-22 DIAGNOSIS — G9340 Encephalopathy, unspecified: Secondary | ICD-10-CM | POA: Diagnosis present

## 2019-08-22 DIAGNOSIS — N390 Urinary tract infection, site not specified: Secondary | ICD-10-CM | POA: Diagnosis present

## 2019-08-22 DIAGNOSIS — E039 Hypothyroidism, unspecified: Secondary | ICD-10-CM

## 2019-08-22 DIAGNOSIS — R7401 Elevation of levels of liver transaminase levels: Secondary | ICD-10-CM | POA: Diagnosis present

## 2019-08-22 DIAGNOSIS — Z20822 Contact with and (suspected) exposure to covid-19: Secondary | ICD-10-CM | POA: Diagnosis present

## 2019-08-22 DIAGNOSIS — J9811 Atelectasis: Secondary | ICD-10-CM | POA: Diagnosis present

## 2019-08-22 DIAGNOSIS — R627 Adult failure to thrive: Secondary | ICD-10-CM | POA: Diagnosis present

## 2019-08-22 DIAGNOSIS — N1831 Chronic kidney disease, stage 3a: Secondary | ICD-10-CM | POA: Diagnosis present

## 2019-08-22 DIAGNOSIS — Z7901 Long term (current) use of anticoagulants: Secondary | ICD-10-CM | POA: Diagnosis not present

## 2019-08-22 DIAGNOSIS — I4891 Unspecified atrial fibrillation: Secondary | ICD-10-CM | POA: Diagnosis not present

## 2019-08-22 DIAGNOSIS — R54 Age-related physical debility: Secondary | ICD-10-CM | POA: Diagnosis present

## 2019-08-22 DIAGNOSIS — K5900 Constipation, unspecified: Secondary | ICD-10-CM

## 2019-08-22 DIAGNOSIS — Z66 Do not resuscitate: Secondary | ICD-10-CM | POA: Diagnosis not present

## 2019-08-22 DIAGNOSIS — Z8249 Family history of ischemic heart disease and other diseases of the circulatory system: Secondary | ICD-10-CM | POA: Diagnosis not present

## 2019-08-22 DIAGNOSIS — A419 Sepsis, unspecified organism: Secondary | ICD-10-CM | POA: Diagnosis present

## 2019-08-22 DIAGNOSIS — I482 Chronic atrial fibrillation, unspecified: Secondary | ICD-10-CM

## 2019-08-22 DIAGNOSIS — E43 Unspecified severe protein-calorie malnutrition: Secondary | ICD-10-CM | POA: Diagnosis present

## 2019-08-22 DIAGNOSIS — Z7401 Bed confinement status: Secondary | ICD-10-CM | POA: Diagnosis not present

## 2019-08-22 DIAGNOSIS — J90 Pleural effusion, not elsewhere classified: Secondary | ICD-10-CM | POA: Diagnosis present

## 2019-08-22 DIAGNOSIS — G9341 Metabolic encephalopathy: Secondary | ICD-10-CM | POA: Diagnosis present

## 2019-08-22 DIAGNOSIS — Z515 Encounter for palliative care: Secondary | ICD-10-CM | POA: Diagnosis not present

## 2019-08-22 DIAGNOSIS — K5641 Fecal impaction: Secondary | ICD-10-CM | POA: Diagnosis present

## 2019-08-22 DIAGNOSIS — I129 Hypertensive chronic kidney disease with stage 1 through stage 4 chronic kidney disease, or unspecified chronic kidney disease: Secondary | ICD-10-CM | POA: Diagnosis present

## 2019-08-22 DIAGNOSIS — Z6820 Body mass index (BMI) 20.0-20.9, adult: Secondary | ICD-10-CM | POA: Diagnosis not present

## 2019-08-22 DIAGNOSIS — R64 Cachexia: Secondary | ICD-10-CM | POA: Diagnosis present

## 2019-08-22 DIAGNOSIS — R6521 Severe sepsis with septic shock: Secondary | ICD-10-CM | POA: Diagnosis present

## 2019-08-22 LAB — PROTIME-INR
INR: 1.2 (ref 0.8–1.2)
Prothrombin Time: 14.4 seconds (ref 11.4–15.2)

## 2019-08-22 LAB — COMPREHENSIVE METABOLIC PANEL
ALT: 57 U/L — ABNORMAL HIGH (ref 0–44)
ALT: 97 U/L — ABNORMAL HIGH (ref 0–44)
ALT: 116 U/L — ABNORMAL HIGH (ref 0–44)
AST: 207 U/L — ABNORMAL HIGH (ref 15–41)
AST: 368 U/L — ABNORMAL HIGH (ref 15–41)
AST: 332 U/L — ABNORMAL HIGH (ref 15–41)
Albumin: 1.8 g/dL — ABNORMAL LOW (ref 3.5–5.0)
Albumin: 2 g/dL — ABNORMAL LOW (ref 3.5–5.0)
Albumin: 1.9 g/dL — ABNORMAL LOW (ref 3.5–5.0)
Alkaline Phosphatase: 72 U/L (ref 38–126)
Alkaline Phosphatase: 72 U/L (ref 38–126)
Alkaline Phosphatase: 68 U/L (ref 38–126)
Anion gap: 18 — ABNORMAL HIGH (ref 5–15)
Anion gap: 13 (ref 5–15)
Anion gap: 18 — ABNORMAL HIGH (ref 5–15)
BUN: 39 mg/dL — ABNORMAL HIGH (ref 8–23)
BUN: 38 mg/dL — ABNORMAL HIGH (ref 8–23)
BUN: 40 mg/dL — ABNORMAL HIGH (ref 8–23)
CO2: 16 mmol/L — ABNORMAL LOW (ref 22–32)
CO2: 20 mmol/L — ABNORMAL LOW (ref 22–32)
CO2: 18 mmol/L — ABNORMAL LOW (ref 22–32)
Calcium: 8.3 mg/dL — ABNORMAL LOW (ref 8.9–10.3)
Calcium: 8.1 mg/dL — ABNORMAL LOW (ref 8.9–10.3)
Calcium: 8 mg/dL — ABNORMAL LOW (ref 8.9–10.3)
Chloride: 106 mmol/L (ref 98–111)
Chloride: 109 mmol/L (ref 98–111)
Chloride: 107 mmol/L (ref 98–111)
Creatinine, Ser: 2.83 mg/dL — ABNORMAL HIGH (ref 0.44–1.00)
Creatinine, Ser: 2.75 mg/dL — ABNORMAL HIGH (ref 0.44–1.00)
Creatinine, Ser: 2.43 mg/dL — ABNORMAL HIGH (ref 0.44–1.00)
GFR calc Af Amer: 16 mL/min — ABNORMAL LOW (ref >60)
GFR calc Af Amer: 16 mL/min — ABNORMAL LOW (ref >60)
GFR calc Af Amer: 19 mL/min — ABNORMAL LOW (ref >60)
GFR calc non Af Amer: 14 mL/min — ABNORMAL LOW (ref >60)
GFR calc non Af Amer: 14 mL/min — ABNORMAL LOW (ref >60)
GFR calc non Af Amer: 16 mL/min — ABNORMAL LOW (ref >60)
Glucose, Bld: 201 mg/dL — ABNORMAL HIGH (ref 70–99)
Glucose, Bld: 118 mg/dL — ABNORMAL HIGH (ref 70–99)
Glucose, Bld: 74 mg/dL (ref 70–99)
Potassium: 4.7 mmol/L (ref 3.5–5.1)
Potassium: 4.3 mmol/L (ref 3.5–5.1)
Potassium: 3.9 mmol/L (ref 3.5–5.1)
Sodium: 140 mmol/L (ref 135–145)
Sodium: 142 mmol/L (ref 135–145)
Sodium: 143 mmol/L (ref 135–145)
Total Bilirubin: 2.9 mg/dL — ABNORMAL HIGH (ref 0.3–1.2)
Total Bilirubin: 1.9 mg/dL — ABNORMAL HIGH (ref 0.3–1.2)
Total Bilirubin: 1.9 mg/dL — ABNORMAL HIGH (ref 0.3–1.2)
Total Protein: 5.3 g/dL — ABNORMAL LOW (ref 6.5–8.1)
Total Protein: 5.3 g/dL — ABNORMAL LOW (ref 6.5–8.1)
Total Protein: 5.2 g/dL — ABNORMAL LOW (ref 6.5–8.1)

## 2019-08-22 LAB — URINE CULTURE

## 2019-08-22 LAB — APTT: aPTT: 31 seconds (ref 24–36)

## 2019-08-22 LAB — CBC WITH DIFFERENTIAL/PLATELET
Abs Immature Granulocytes: 0.11 10*3/uL — ABNORMAL HIGH (ref 0.00–0.07)
Basophils Absolute: 0 10*3/uL (ref 0.0–0.1)
Basophils Relative: 0 %
Eosinophils Absolute: 0 10*3/uL (ref 0.0–0.5)
Eosinophils Relative: 0 %
HCT: 40.7 % (ref 36.0–46.0)
Hemoglobin: 12.5 g/dL (ref 12.0–15.0)
Immature Granulocytes: 1 %
Lymphocytes Relative: 5 %
Lymphs Abs: 0.6 10*3/uL — ABNORMAL LOW (ref 0.7–4.0)
MCH: 33.6 pg (ref 26.0–34.0)
MCHC: 30.7 g/dL (ref 30.0–36.0)
MCV: 109.4 fL — ABNORMAL HIGH (ref 80.0–100.0)
Monocytes Absolute: 0.4 10*3/uL (ref 0.1–1.0)
Monocytes Relative: 4 %
Neutro Abs: 10.2 10*3/uL — ABNORMAL HIGH (ref 1.7–7.7)
Neutrophils Relative %: 90 %
Platelets: 180 10*3/uL (ref 150–400)
RBC: 3.72 MIL/uL — ABNORMAL LOW (ref 3.87–5.11)
RDW: 16.2 % — ABNORMAL HIGH (ref 11.5–15.5)
WBC: 11.3 10*3/uL — ABNORMAL HIGH (ref 4.0–10.5)
nRBC: 0.5 % — ABNORMAL HIGH (ref 0.0–0.2)

## 2019-08-22 LAB — SARS CORONAVIRUS 2 BY RT PCR (HOSPITAL ORDER, PERFORMED IN ~~LOC~~ HOSPITAL LAB): SARS Coronavirus 2: NEGATIVE

## 2019-08-22 LAB — CBC
HCT: 41.6 % (ref 36.0–46.0)
Hemoglobin: 13.1 g/dL (ref 12.0–15.0)
MCH: 34 pg (ref 26.0–34.0)
MCHC: 31.5 g/dL (ref 30.0–36.0)
MCV: 108.1 fL — ABNORMAL HIGH (ref 80.0–100.0)
Platelets: 129 10*3/uL — ABNORMAL LOW (ref 150–400)
RBC: 3.85 MIL/uL — ABNORMAL LOW (ref 3.87–5.11)
RDW: 15.9 % — ABNORMAL HIGH (ref 11.5–15.5)
WBC: 12 10*3/uL — ABNORMAL HIGH (ref 4.0–10.5)
nRBC: 0.3 % — ABNORMAL HIGH (ref 0.0–0.2)

## 2019-08-22 LAB — GLUCOSE, CAPILLARY: Glucose-Capillary: 47 mg/dL — ABNORMAL LOW (ref 70–99)

## 2019-08-22 LAB — LACTIC ACID, PLASMA
Lactic Acid, Venous: 3.1 mmol/L (ref 0.5–1.9)
Lactic Acid, Venous: 3.2 mmol/L (ref 0.5–1.9)
Lactic Acid, Venous: 3.4 mmol/L (ref 0.5–1.9)
Lactic Acid, Venous: 6.4 mmol/L (ref 0.5–1.9)
Lactic Acid, Venous: 6.6 mmol/L (ref 0.5–1.9)

## 2019-08-22 LAB — TROPONIN I (HIGH SENSITIVITY)
Troponin I (High Sensitivity): 39 ng/L — ABNORMAL HIGH (ref <18)
Troponin I (High Sensitivity): 40 ng/L — ABNORMAL HIGH (ref <18)

## 2019-08-22 LAB — TSH: TSH: 6.005 u[IU]/mL — ABNORMAL HIGH (ref 0.350–4.500)

## 2019-08-22 LAB — BRAIN NATRIURETIC PEPTIDE: B Natriuretic Peptide: 1277.4 pg/mL — ABNORMAL HIGH (ref 0.0–100.0)

## 2019-08-22 LAB — PROCALCITONIN: Procalcitonin: 0.23 ng/mL

## 2019-08-22 LAB — HEPARIN LEVEL (UNFRACTIONATED): Heparin Unfractionated: 0.65 IU/mL (ref 0.30–0.70)

## 2019-08-22 MED ORDER — HEPARIN (PORCINE) 25000 UT/250ML-% IV SOLN
650.0000 [IU]/h | INTRAVENOUS | Status: DC
  Administered 2019-08-22: 700 [IU]/h via INTRAVENOUS
  Filled 2019-08-22: qty 250

## 2019-08-22 MED ORDER — MORPHINE SULFATE (PF) 2 MG/ML IV SOLN
2.0000 mg | Freq: Once | INTRAVENOUS | Status: AC
  Administered 2019-08-22: 2 mg via INTRAVENOUS
  Filled 2019-08-22: qty 1

## 2019-08-22 MED ORDER — LEVOTHYROXINE SODIUM 100 MCG/5ML IV SOLN: 37.5000 ug | Freq: Every day | INTRAVENOUS | Status: DC

## 2019-08-22 MED ORDER — SODIUM CHLORIDE 0.9 % IV BOLUS
1000.0000 mL | Freq: Once | INTRAVENOUS | Status: AC
  Administered 2019-08-22: 1000 mL via INTRAVENOUS

## 2019-08-22 MED ORDER — BISACODYL 10 MG RE SUPP
10.0000 mg | Freq: Once | RECTAL | Status: AC
  Administered 2019-08-22: 10 mg via RECTAL
  Filled 2019-08-22: qty 1

## 2019-08-22 MED ORDER — SODIUM CHLORIDE 0.9 % IV SOLN: INTRAVENOUS | Status: DC

## 2019-08-22 MED ORDER — SODIUM CHLORIDE 0.9 % IV SOLN
1.0000 g | INTRAVENOUS | Status: DC
  Administered 2019-08-22: 1 g via INTRAVENOUS
  Filled 2019-08-22: qty 10

## 2019-08-22 MED ORDER — STERILE WATER FOR INJECTION IV SOLN
INTRAVENOUS | Status: DC
  Filled 2019-08-22 (×6): qty 850

## 2019-08-22 MED ORDER — SODIUM CHLORIDE 0.9 % IV BOLUS
500.0000 mL | Freq: Once | INTRAVENOUS | Status: AC
  Administered 2019-08-22: 500 mL via INTRAVENOUS

## 2019-08-22 MED ORDER — LACTATED RINGERS IV BOLUS
1000.0000 mL | Freq: Once | INTRAVENOUS | Status: AC
  Administered 2019-08-22: 1000 mL via INTRAVENOUS

## 2019-08-22 NOTE — Assessment & Plan Note (Signed)
-  Son states she has not been eating or drinking for several weeks -At this time likely prerenal.  Possibly even ATN from prolonged prerenal -Continue fluids, again will de-escalate pending further family discussions

## 2019-08-22 NOTE — Hospital Course (Signed)
Carrie Morris is a 86 yo CF with PMH afib (on Coumadin at home), hypothyroidism, hypertension who presented to the hospital after her son noticed that she has become progressively less responsive at home. He endorses that she has had a tremendous physical decline over the past several months.  She continues to not eat and has ongoing weight loss.  She has become frail and cachectic.  She is completely dependent on all ADLs and her son has been caring for her at home.  He also states that he was planning on retiring this month to further continue caring for her.  He says that her speech has been unintelligible for the last couple weeks because of her ongoing decline.  Despite stating, she has been declining, when we discussed her overall poor state of health, poor prognosis, and that she seems to be approaching end-of-life, he was still insistent on aggressive medical care.  On admission, CODE STATUS was discussed and he did change patient's CODE STATUS to DNR/DNI.  He has a brother and [place] whom he says wants to come and see the patient and have further family discussions and goals of care discussions.  He is amenable to meeting with palliative care to also start discussing next steps.  On work-up, she was found to be septic from presumed UTI and in A. fib with RVR.  She was fluid resuscitated, started on antibiotics, and started on a heparin drip due to subtherapeutic INR.  We discussed discontinuing the heparin drip in the ER, but son was also insistent on continuing treatment for now.

## 2019-08-22 NOTE — Assessment & Plan Note (Signed)
-  Differential includes cholestasis of sepsis versus shock liver versus other ischemia - trend LFTs; not a candidate for much treatment which again I believe to be futile at this time

## 2019-08-22 NOTE — ED Notes (Signed)
RN paged Dr. Julian Reil in regard to the downward trend of the patients blood pressures.

## 2019-08-22 NOTE — Assessment & Plan Note (Signed)
-  UA consistent with infection.  Patient's underlying encephalopathy also is likely contributed to from septic infection as well -Initial lactic acid 6.6 on admission.  This has slowly down trended with fluid resuscitation -Patient also started on bicarb drip for severe acidosis.  Follow-up repeat BMP this afternoon and transition fluids as indicated -Continue Rocephin and follow-up cultures -Overall, patient has extremely poor quality of life.  She is bedbound at home, dependent on all ADLs.  She has had significant ongoing weight loss and ensuing cachexia. She is severely constipated with ~7cm stool impaction at anus. She is in multi organ failure.  Son has been her main caretaker who is present bedside in the ER. I have explained all this to son in detail bedside and in addition to a component of denial I have perceived, he also wishes for his brother to come from [place] to also see her and help guide further discussions. He is however thankful for the honesty and the care being give.  There is a high likelihood that she will not survive this hospitalization regardless of aggressive measures versus pursuing comfort care.  Family has agreed to talk with palliative care, whom has been consulted, appreciate assistance

## 2019-08-22 NOTE — Assessment & Plan Note (Signed)
-   see sepsis 

## 2019-08-22 NOTE — Progress Notes (Signed)
PROGRESS NOTE    Carrie Morris   EZM:[id number]  DOB: 1921-05-25  DOA: 08/22/2019     0  PCP: Prince Solian, MD  CC: less responsive at home  Hospital Course: Carrie Morris is a 86 yo CF with PMH afib (on Coumadin at home), hypothyroidism, hypertension who presented to the hospital after her son noticed that she has become progressively less responsive at home. He endorses that she has had a tremendous physical decline over the past several months.  She continues to not eat and has ongoing weight loss.  She has become frail and cachectic.  She is completely dependent on all ADLs and her son has been caring for her at home.  He also states that he was planning on retiring this month to further continue caring for her.  He says that her speech has been unintelligible for the last couple weeks because of her ongoing decline.  Despite stating, she has been declining, when we discussed her overall poor state of health, poor prognosis, and that she seems to be approaching end-of-life, he was still insistent on aggressive medical care.  On admission, CODE STATUS was discussed and he did change patient's CODE STATUS to DNR/DNI.  He has a brother and [place] whom he says wants to come and see the patient and have further family discussions and goals of care discussions.  He is amenable to meeting with palliative care to also start discussing next steps.  On work-up, she was found to be septic from presumed UTI and in A. fib with RVR.  She was fluid resuscitated, started on antibiotics, and started on a heparin drip due to subtherapeutic INR.  We discussed discontinuing the heparin drip in the ER, but son was also insistent on continuing treatment for now.   Interval History:  Met with son bedside and discussed findings noted in hospital course above.  We will consult palliative care and await further family discussions regarding how aggressive to remain in her medical care.  Old records reviewed  in assessment of this patient  ROS: Review of systems not obtained due to patient factors.  Cognitive impairment and minimally responsive  Assessment & Plan: Lactic acidosis - see sepsis  Chronic atrial fibrillation (HCC) -Subtherapeutic INR.  Patient was started on heparin drip on admission.  Explained to son that this is likely futile medical care at this time, however family still wishing to pursue aggressive medical care until further goals of care discussions and his brother can arrive from [place] care consulted to help facilitate further goals of care, appreciate assistance -Continuing heparin drip for now per family's wish, but will de-escalate therapy pending further discussions  Hypothyroidism - doubtful she will resume orals; starting IV synthroid also likely futile; patient would benefit from hospice at this time - await further [place] discussions with PC  [facility]acute kidney [facility]) -Son states she has not been eating or drinking for several weeks -At this time likely prerenal.  Possibly even ATN from prolonged prerenal -Continue fluids, again will de-escalate pending further family discussions  Acute lower UTI -Urinalysis consistent with signs of infection.  Follow-up urine culture -Continue Rocephin for now -Will de-escalate pending further goals of care  Acute encephalopathy -Likely multifactorial in setting of sepsis as well as ongoing physical decline as she has not been eating or drinking well and progressively worsening at home -For now, pursue keeping patient comfortable and monitoring for significant signs of delirium  Transaminitis -Differential includes cholestasis of sepsis  versus shock liver versus other ischemia - trend LFTs; not a candidate for much treatment which again I believe to be futile at this time  Constipation - severe fecal impaction noted on CT abd/pelvis - will consider manual disimpaction, but if pursuing comfort  care, she is more likely to benefit from medications for comfort which again may worsen constipation   Sepsis secondary to UTI ([place]) -UA consistent with infection.  Patient's underlying encephalopathy also is likely contributed to from septic infection as well -Initial lactic acid 6.6 on admission.  This has slowly down trended with fluid resuscitation -Patient also started on bicarb drip for severe acidosis.  Follow-up repeat BMP this afternoon and transition fluids as indicated -Continue Rocephin and follow-up cultures -Overall, patient has extremely poor quality of life.  She is bedbound at home, dependent on all ADLs.  She has had significant ongoing weight loss and ensuing cachexia. She is severely constipated with ~7cm stool impaction at anus. She is in multi organ failure.  Son has been her main caretaker who is present bedside in the ER. I have explained all this to son in detail bedside and in addition to a component of denial I have perceived, he also wishes for his brother to come from [place] to also see her and help guide further discussions. He is however thankful for the honesty and the care being give.  There is a high likelihood that she will not survive this hospitalization regardless of aggressive measures versus pursuing comfort care.  Family has agreed to talk with palliative care, whom has been consulted, appreciate assistance  Atrial fibrillation with RVR ([place]) - currently not requiring infusion however BP is low/normal and likely would not tolerate Cardizem infusion.  Also think that amiodarone infusion would be too aggressive and again futile as previously noted throughout A&P -If rate does become elevated, would consider as needed pushes of Lopressor -Will continue treating underlying infection which is culprit, however prognosis remains poor   Antimicrobials: Rocephin 08/12/2019>> present  DVT prophylaxis: Heparin drip Code Status: DNR/DNI Family Communication:  Son bedside Disposition Plan:  Status is: Inpatient  Remains inpatient appropriate because:Hemodynamically unstable, Persistent severe electrolyte disturbances, Altered mental status, Ongoing diagnostic testing needed not appropriate for outpatient work up, Unsafe d/c plan, IV treatments appropriate due to intensity of illness or inability to take PO and Inpatient level of care appropriate due to severity of illness   Dispo: The patient is from: Home              Anticipated d/c is to: Pending palliative care evaluation and further discussions              Anticipated d/c date is: 2 days              Patient currently is not medically stable to d/c.       Objective: Blood pressure 99/81, pulse 91, temperature 98.8 F (37.1 C), temperature source Axillary, resp. rate 16, SpO2 96 %.  Examination: General appearance: Chronically ill-appearing and cachectic appearing very elderly woman laying in bed minimally responsive with occasional moaning and appearing to be uncomfortable but in no overt distress Head: No obvious trauma or abnormalities.  Facial bones sunken in Eyes: EOMI Lungs: clear to auscultation bilaterally Heart: irregularly irregular rhythm and S1, S2 normal Abdomen: Thin, soft, bowel sounds hypoactive Extremities: Thin with no edema Skin: Diffuse scattered bruising and some excoriations throughout.  Extremely dry, flaky Neurologic: Moves all 4 extremities otherwise unable to follow commands  Consultants:   Palliative care  Data Reviewed: I have personally reviewed following labs and imaging studies Results for orders placed or performed during the hospital encounter of 08/22/2019 (from the past 24 hour(s))  Blood Culture (routine x 2)     Status: None (Preliminary result)   Collection Time: 08/18/2019 10:20 PM   Specimen: BLOOD  Result Value Ref Range   Specimen Description BLOOD RIGHT ANTECUBITAL    Special Requests      BOTTLES DRAWN AEROBIC AND ANAEROBIC Blood  Culture results may not be optimal due to an inadequate volume of blood received in culture bottles   Culture      NO GROWTH < 12 HOURS Performed at [facility], [place] [address], [place], [place] [address]    Report Status PENDING   Lactic acid, plasma     Status: Abnormal   Collection Time: 08/24/2019 10:37 PM  Result Value Ref Range   Lactic Acid, Venous 6.6 (HH) 0.5 - 1.9 mmol/L  Comprehensive metabolic panel     Status: Abnormal   Collection Time: 08/04/2019 10:37 PM  Result Value Ref Range   Sodium 140 135 - 145 mmol/L   Potassium 4.7 3.5 - 5.1 mmol/L   Chloride 106 98 - 111 mmol/L   CO2 16 (L) 22 - 32 mmol/L   Glucose, Bld 201 (H) 70 - 99 mg/dL   BUN 39 (H) 8 - 23 mg/dL   Creatinine, Ser 2.83 (H) 0.44 - 1.00 mg/dL   Calcium 8.3 (L) 8.9 - 10.3 mg/dL   Total Protein 5.3 (L) 6.5 - 8.1 g/dL   Albumin 1.8 (L) 3.5 - 5.0 g/dL   AST 207 (H) 15 - 41 U/L   ALT 57 (H) 0 - 44 U/L   Alkaline Phosphatase 72 38 - 126 U/L   Total Bilirubin 2.9 (H) 0.3 - 1.2 mg/dL   GFR calc non Af Amer 14 (L) >60 mL/min   GFR calc Af Amer 16 (L) >60 mL/min   Anion gap 18 (H) 5 - 15  CBC WITH DIFFERENTIAL     Status: Abnormal   Collection Time: 07/31/2019 10:37 PM  Result Value Ref Range   WBC 11.3 (H) 4.0 - 10.5 K/uL   RBC 3.72 (L) 3.87 - 5.11 MIL/uL   Hemoglobin 12.5 12.0 - 15.0 g/dL   HCT 40.7 36 - 46 %   MCV 109.4 (H) 80.0 - 100.0 fL   MCH 33.6 26.0 - 34.0 pg   MCHC 30.7 30.0 - 36.0 g/dL   RDW 16.2 (H) 11.5 - 15.5 %   Platelets 180 150 - 400 K/uL   nRBC 0.5 (H) 0.0 - 0.2 %   Neutrophils Relative % 90 %   Neutro Abs 10.2 (H) 1.7 - 7.7 K/uL   Lymphocytes Relative 5 %   Lymphs Abs 0.6 (L) 0.7 - 4.0 K/uL   Monocytes Relative 4 %   Monocytes Absolute 0.4 0 - 1 K/uL   Eosinophils Relative 0 %   Eosinophils Absolute 0.0 0 - 0 K/uL   Basophils Relative 0 %   Basophils Absolute 0.0 0 - 0 K/uL   Immature Granulocytes 1 %   Abs Immature Granulocytes 0.11 (H) 0.00 - 0.07 K/uL  Urinalysis, Routine  w reflex microscopic     Status: Abnormal   Collection Time: 08/05/2019 10:37 PM  Result Value Ref Range   Color, Urine AMBER (A) YELLOW   APPearance CLOUDY (A) CLEAR   Specific Gravity, Urine 1.019 1.005 - 1.030  pH 5.0 5.0 - 8.0   Glucose, UA NEGATIVE NEGATIVE mg/dL   Hgb urine dipstick MODERATE (A) NEGATIVE   Bilirubin Urine SMALL (A) NEGATIVE   Ketones, ur 5 (A) NEGATIVE mg/dL   Protein, ur 100 (A) NEGATIVE mg/dL   Nitrite NEGATIVE NEGATIVE   Leukocytes,Ua MODERATE (A) NEGATIVE   RBC / HPF 6-10 0 - 5 RBC/hpf   WBC, UA >50 (H) 0 - 5 WBC/hpf   Bacteria, UA MANY (A) NONE SEEN   Squamous Epithelial / LPF 21-50 0 - 5   WBC Clumps PRESENT    Mucus PRESENT    Hyaline Casts, UA PRESENT   Troponin I (High Sensitivity)     Status: Abnormal   Collection Time: 08/09/2019 10:37 PM  Result Value Ref Range   Troponin I (High Sensitivity) 40 (H) <18 ng/L  Brain natriuretic peptide     Status: Abnormal   Collection Time: 08/01/2019 10:38 PM  Result Value Ref Range   B Natriuretic Peptide 1,277.4 (H) 0.0 - 100.0 pg/mL  Blood Culture (routine x 2)     Status: None (Preliminary result)   Collection Time: 08/11/2019 10:50 PM   Specimen: BLOOD  Result Value Ref Range   Specimen Description BLOOD LEFT ANTECUBITAL    Special Requests      BOTTLES DRAWN AEROBIC AND ANAEROBIC Blood Culture results may not be optimal due to an inadequate volume of blood received in culture bottles   Culture      NO GROWTH < 12 HOURS Performed at [facility], [address], [place], [place] [address]    Report Status PENDING   Protime-INR     Status: None   Collection Time: 08/19/2019 11:45 PM  Result Value Ref Range   Prothrombin Time 14.4 11.4 - 15.2 seconds   INR 1.2 0.8 - 1.2  APTT     Status: None   Collection Time: 08/11/2019 11:45 PM  Result Value Ref Range   aPTT 31 24 - 36 seconds  Lactic acid, plasma     Status: Abnormal   Collection Time: 08/22/19 12:34 AM  Result Value Ref Range   Lactic Acid,  Venous 6.4 (HH) 0.5 - 1.9 mmol/L  Troponin I (High Sensitivity)     Status: Abnormal   Collection Time: 08/22/19 12:34 AM  Result Value Ref Range   Troponin I (High Sensitivity) 39 (H) <18 ng/L  SARS Coronavirus 2 by RT PCR (hospital order, performed in [facility] hospital lab) Nasopharyngeal Nasopharyngeal Swab     Status: None   Collection Time: 08/22/19 12:42 AM   Specimen: Nasopharyngeal Swab  Result Value Ref Range   SARS Coronavirus 2 NEGATIVE NEGATIVE  Lactic acid, plasma     Status: Abnormal   Collection Time: 08/22/19  2:00 AM  Result Value Ref Range   Lactic Acid, Venous 3.2 (HH) 0.5 - 1.9 mmol/L  TSH     Status: Abnormal   Collection Time: 08/22/19  3:48 AM  Result Value Ref Range   TSH 6.005 (H) 0.350 - 4.500 uIU/mL  Procalcitonin - Baseline     Status: None   Collection Time: 08/22/19  3:48 AM  Result Value Ref Range   Procalcitonin 0.23 ng/mL  CBC     Status: Abnormal   Collection Time: 08/22/19  3:48 AM  Result Value Ref Range   WBC 12.0 (H) 4.0 - 10.5 K/uL   RBC 3.85 (L) 3.87 - 5.11 MIL/uL   Hemoglobin 13.1 12.0 - 15.0 g/dL   HCT  41.6 36 - 46 %   MCV 108.1 (H) 80.0 - 100.0 fL   MCH 34.0 26.0 - 34.0 pg   MCHC 31.5 30.0 - 36.0 g/dL   RDW 15.9 (H) 11.5 - 15.5 %   Platelets 129 (L) 150 - 400 K/uL   nRBC 0.3 (H) 0.0 - 0.2 %  Comprehensive metabolic panel     Status: Abnormal   Collection Time: 08/22/19  3:48 AM  Result Value Ref Range   Sodium 142 135 - 145 mmol/L   Potassium 4.3 3.5 - 5.1 mmol/L   Chloride 109 98 - 111 mmol/L   CO2 20 (L) 22 - 32 mmol/L   Glucose, Bld 118 (H) 70 - 99 mg/dL   BUN 38 (H) 8 - 23 mg/dL   Creatinine, Ser 2.75 (H) 0.44 - 1.00 mg/dL   Calcium 8.1 (L) 8.9 - 10.3 mg/dL   Total Protein 5.3 (L) 6.5 - 8.1 g/dL   Albumin 2.0 (L) 3.5 - 5.0 g/dL   AST 368 (H) 15 - 41 U/L   ALT 97 (H) 0 - 44 U/L   Alkaline Phosphatase 72 38 - 126 U/L   Total Bilirubin 1.9 (H) 0.3 - 1.2 mg/dL   GFR calc non Af Amer 14 (L) >60 mL/min   GFR calc Af Amer  16 (L) >60 mL/min   Anion gap 13 5 - 15  Lactic acid, plasma     Status: Abnormal   Collection Time: 08/22/19  3:50 AM  Result Value Ref Range   Lactic Acid, Venous 3.4 (HH) 0.5 - 1.9 mmol/L  Heparin level (unfractionated)     Status: None   Collection Time: 08/22/19 12:14 PM  Result Value Ref Range   Heparin Unfractionated 0.65 0.30 - 0.70 IU/mL  Lactic acid, plasma     Status: Abnormal   Collection Time: 08/22/19 12:14 PM  Result Value Ref Range   Lactic Acid, Venous 3.1 (HH) 0.5 - 1.9 mmol/L    Recent Results (from the past 240 hour(s))  Blood Culture (routine x 2)     Status: None (Preliminary result)   Collection Time: 08/20/2019 10:20 PM   Specimen: BLOOD  Result Value Ref Range Status   Specimen Description BLOOD RIGHT ANTECUBITAL  Final   Special Requests   Final    BOTTLES DRAWN AEROBIC AND ANAEROBIC Blood Culture results may not be optimal due to an inadequate volume of blood received in culture bottles   Culture   Final    NO GROWTH < 12 HOURS Performed at [facility], [address], [place], [place] [address]    Report Status PENDING  Incomplete  Blood Culture (routine x 2)     Status: None (Preliminary result)   Collection Time: 08/09/2019 10:50 PM   Specimen: BLOOD  Result Value Ref Range Status   Specimen Description BLOOD LEFT ANTECUBITAL  Final   Special Requests   Final    BOTTLES DRAWN AEROBIC AND ANAEROBIC Blood Culture results may not be optimal due to an inadequate volume of blood received in culture bottles   Culture   Final    NO GROWTH < 12 HOURS Performed at [facility], [place] [address] [place] [address], [place], [place] [address]    Report Status PENDING  Incomplete  SARS Coronavirus 2 by RT PCR (hospital order, performed in [facility] hospital lab) Nasopharyngeal Nasopharyngeal Swab     Status: None   Collection Time: 08/22/19 12:42 AM   Specimen: Nasopharyngeal Swab  Result Value  Ref Range Status   SARS Coronavirus 2 NEGATIVE NEGATIVE Final     Comment: (NOTE) SARS-CoV-2 target nucleic acids are NOT DETECTED.  The SARS-CoV-2 RNA is generally detectable in upper and lower respiratory specimens during the acute phase of infection. The lowest concentration of SARS-CoV-2 viral copies this assay can detect is 250 copies / mL. A negative result does not preclude SARS-CoV-2 infection and should not be used as the sole basis for treatment or other patient management decisions.  A negative result may occur with improper specimen collection / handling, submission of specimen other than nasopharyngeal swab, presence of viral mutation(s) within the areas targeted by this assay, and inadequate number of viral copies (<250 copies / mL). A negative result must be combined with clinical observations, patient history, and epidemiological information.  Fact Sheet for Patients:   StrictlyIdeas.no  Fact Sheet for Healthcare Providers: BankingDealers.co.za  This test is not yet approved or  cleared by the [place] FDA and has been authorized for detection and/or diagnosis of SARS-CoV-2 by FDA under an Emergency Use Authorization (EUA).  This EUA will remain in effect (meaning this test can be used) for the duration of the COVID-19 declaration under Section 564(b)(1) of the Act, 21 U.S.C. section 360bbb-3(b)(1), unless the authorization is terminated or revoked sooner.  Performed at [facility], [place] [address], [place],  [address]      Radiology Studies: CT ABDOMEN PELVIS WO CONTRAST  Result Date: 08/22/2019 CLINICAL DATA:  Sepsis EXAM: CT ABDOMEN AND PELVIS WITHOUT CONTRAST TECHNIQUE: Multidetector CT imaging of the abdomen and pelvis was performed following the standard protocol without IV contrast. COMPARISON:  11/27/2010 FINDINGS: LOWER CHEST: Small right pleural effusion with basilar atelectasis. Large hiatal hernia. There is atherosclerotic calcification of the mitral  valve. HEPATOBILIARY: Normal hepatic contours. No intra- or extrahepatic biliary dilatation. There is cholelithiasis without acute inflammation. PANCREAS: Normal pancreas. No ductal dilatation or peripancreatic fluid collection. SPLEEN: Normal. ADRENALS/URINARY TRACT: The adrenal glands are normal. No hydronephrosis, nephroureterolithiasis or solid renal mass. The urinary bladder is normal for degree of distention STOMACH/BOWEL: There is a large hiatal hernia that contains segment of small bowel. The stomach is mostly decompressed. No small bowel dilatation or inflammation. There is a large stool ball in the rectum. Normal appendix. VASCULAR/LYMPHATIC: There is calcific atherosclerosis of the abdominal aorta. No lymphadenopathy. REPRODUCTIVE: Normal uterus. No adnexal mass. MUSCULOSKELETAL. Lumbar levoscoliosis. No acute abnormality. Multilevel advanced facet arthrosis and vertebral osteophytosis. OTHER: None. IMPRESSION: 1. No acute abnormality of the abdomen or pelvis. 2. Large hiatal hernia. 3. Large stool ball in the rectum. 4. Small right pleural effusion with basilar atelectasis. 5. Aortic Atherosclerosis (ICD10-I70.0). Electronically Signed   By: Ulyses Jarred M.D.   On: 08/22/2019 03:11   CT Head Wo Contrast  Result Date: 08/26/2019 CLINICAL DATA:  Recent mental status change EXAM: CT HEAD WITHOUT CONTRAST TECHNIQUE: Contiguous axial images were obtained from the base of the skull through the vertex without intravenous contrast. COMPARISON:  07/05/2011 FINDINGS: Brain: Chronic atrophic and white matter ischemic changes are again seen and stable. No findings to suggest acute hemorrhage, acute infarction or space-occupying mass lesion are seen. Vascular: No hyperdense vessel or unexpected calcification. Skull: Normal. Negative for fracture or focal lesion. Sinuses/Orbits: No acute finding. Other: None. IMPRESSION: Chronic atrophic and ischemic changes.  No acute abnormality noted. Electronically Signed    By: Inez Catalina M.D.   On: 08/01/2019 23:40   DG Chest Port 1 View  Result Date:  08/13/2019 CLINICAL DATA:  86 year old female with weakness. EXAM: PORTABLE CHEST 1 VIEW COMPARISON:  Chest radiograph dated 07/05/2014. FINDINGS: Probable trace bilateral pleural effusions and bibasilar atelectasis. No lobar consolidation or pneumothorax. Stable cardiomegaly. Atherosclerotic calcification of the aorta. The previously seen hiatal hernia is less conspicuous on today's exam. Osteopenia. No acute osseous pathology. IMPRESSION: 1. Probable trace bilateral pleural effusions and bibasilar atelectasis. 2. Stable cardiomegaly. Electronically Signed   By: Anner Crete M.D.   On: 07/31/2019 22:54   CT ABDOMEN PELVIS WO CONTRAST  Final Result    CT Head Wo Contrast  Final Result    DG Chest Port 1 View  Final Result       Scheduled Meds: . [START ON 08/25/2019] levothyroxine  37.5 mcg Intravenous Daily   PRN Meds:  Continuous Infusions: . cefTRIAXone (ROCEPHIN)  IV    . heparin 700 Units/hr (08/22/19 0418)  .  sodium bicarbonate (isotonic) infusion in sterile water 100 mL/hr at 08/22/19 1548      LOS: 0 days  Time spent: Greater than 50% of the 35 minute visit was spent in counseling/coordination of care for the patient as laid out in the A&P.   Dwyane Dee, MD [facility] 08/22/2019, 4:29 PM   Contact via secure chat.  To contact the attending provider between 7A-7P or the covering provider during after hours 7P-7A, please log into the web site [email or web address] and access using universal [place] password for that web site. If you do not have the password, please call the hospital operator.

## 2019-08-22 NOTE — Progress Notes (Signed)
SBPs in the 80s.  Mucous membranes still dry when I saw pt an hour ago.  Will put in for 1L NS bolus.

## 2019-08-22 NOTE — Progress Notes (Signed)
After looking at labs and speaking with ED RN, Lactic acid was elevated 6.6 on 7/25 at 22:37, Blood cultures drawn on 7/25 at 22:20, . Abx given on 7/25 at 23:20.  Follow up lactic acid levels drawn at appropriate times. Sepsis protocol not called till 7/26 at 0131 yet all the appropriate measures were taken prior to calling sepsis. Fuids were given appropriately as well as continuous fluids of Bicarb at 100cc/hr were ordered

## 2019-08-22 NOTE — Assessment & Plan Note (Signed)
-   severe fecal impaction noted on CT abd/pelvis - will consider manual disimpaction, but if pursuing comfort care, she is more likely to benefit from medications for comfort which again may worsen constipation

## 2019-08-22 NOTE — ED Notes (Signed)
Attempted report x 2 

## 2019-08-22 NOTE — ED Provider Notes (Addendum)
I assumed care of this patient.  Please see previous provider note for further details of Hx, PE.  Briefly patient is a 86 y.o. female who presented with AMS and fatigue for 1 weeks, gradual onset. Found to be in  AFib by son, who called EMS who attempted cardioversion due to [facility] with hypotension. Marland Kitchen   UA concerning for infection. Given Rocephin.   Plan to follow up labs and admit for further management.  Labs notable for leukocytosis, acute renal insufficiency and elevated lactic acid greater than 6.  Code sepsis was initiated and patient's was given additional IV fluid bolus to provide 30 cc/kg.  Patient has already been started on antibiotics, for likely urinary source.  After 30 cc/kg of IV fluids, lactic acid trending down.  Patient's blood pressure stabilized.  Heart rate has improved.  Still in A. fib though.  Given the hematuria and renal sufficiency, CT Noncon was obtained to rule out obstructing stone which was negative.  Patient was admitted to hospital service for further work-up and management.  .Critical Care Performed by: Nira Conn, MD Authorized by: Nira Conn, MD    CRITICAL CARE Performed by: Amadeo Garnet Sabrea Sankey Total critical care time: 75 minutes Critical care time was exclusive of separately billable procedures and treating other patients. Critical care was necessary to treat or prevent imminent or life-threatening deterioration. Critical care was time spent personally by me on the following activities: development of treatment plan with patient and/or surrogate as well as nursing, discussions with consultants, evaluation of patient's response to treatment, examination of patient, obtaining history from patient or surrogate, ordering and performing treatments and interventions, ordering and review of laboratory studies, ordering and review of radiographic studies, pulse oximetry and re-evaluation of patient's condition.       Nira Conn, MD 08/22/19 [phone]

## 2019-08-22 NOTE — Assessment & Plan Note (Signed)
-  Subtherapeutic INR.  Patient was started on heparin drip on admission.  Explained to son that this is likely futile medical care at this time, however family still wishing to pursue aggressive medical care until further goals of care discussions and his brother can arrive from [facility] -Palliative care consulted to help facilitate further goals of care, appreciate assistance -Continuing heparin drip for now per family's wish, but will de-escalate therapy pending further discussions

## 2019-08-22 NOTE — Assessment & Plan Note (Signed)
-  Likely multifactorial in setting of sepsis as well as ongoing physical decline as she has not been eating or drinking well and progressively worsening at home -For now, pursue keeping patient comfortable and monitoring for significant signs of delirium

## 2019-08-22 NOTE — Progress Notes (Signed)
ANTICOAGULATION CONSULT NOTE  Pharmacy Consult for heparin Indication: atrial fibrillation  Allergies  Allergen Reactions   Zantac [Ranitidine Hcl] Hives    Patient Measurements:   Heparin Dosing Weight: 57 kg  Vital Signs: BP: 102/75 (07/26 1018) Pulse Rate: 91 (07/26 1018)  Labs: Recent Labs    2019/08/24 2237 08-24-2019 2345 08/22/19 0034 08/22/19 0348 08/22/19 1214  HGB 12.5  --   --  13.1  --   HCT 40.7  --   --  41.6  --   PLT 180  --   --  129*  --   APTT  --  31  --   --   --   LABPROT  --  14.4  --   --   --   INR  --  1.2  --   --   --   HEPARINUNFRC  --   --   --   --  0.65  CREATININE 2.83*  --   --  2.75*  --   TROPONINIHS 40*  --  39*  --   --     CrCl cannot be calculated (Unknown ideal weight.).   Medical History: Past Medical History:  Diagnosis Date   Atrial fibrillation (HCC)    Bilateral swelling of feet    Dysrhythmia    a-fib   E-coli UTI    History of in 11/ 2012 admission   Esophageal diverticulum    Gout    High blood pressure    Thyroid disease    Assessment: 89 yof on warfarin PTA for history of afib presenting with afib with RVR and AMS. Pharmacy consulted to transition from warfarin to heparin given AMS affecting ability to take PO. INR 1.2 on admit - noted, patient has not taken warfarin in 2 weeks per med rec note.   Initial heparin level therapeutic at 0.65 but near top of range. Hg wnl, plt down to 129. No bleeding or issues with infusion per discussion with RN.  Goal of Therapy:  Heparin level 0.3-0.7 units/ml Monitor platelets by anticoagulation protocol: Yes   Plan:  Reduce heparin IV slightly to 650 units/hr to ensure stays in range Monitor daily heparin level and CBC, s/sx bleeding   Leia Alf, PharmD, BCPS Please check AMION for all [facility] Pharmacy contact numbers Clinical Pharmacist 08/22/2019 2:21 PM

## 2019-08-22 NOTE — ED Notes (Signed)
RN spoke with Julian Reil, MD. Orders placed for NS Bolus.

## 2019-08-22 NOTE — Assessment & Plan Note (Signed)
-   currently not requiring infusion however BP is low/normal and likely would not tolerate Cardizem infusion.  Also think that amiodarone infusion would be too aggressive and again futile as previously noted throughout A&P -If rate does become elevated, would consider as needed pushes of Lopressor -Will continue treating underlying infection which is culprit, however prognosis remains poor

## 2019-08-22 NOTE — ED Notes (Signed)
Attempted report 

## 2019-08-22 NOTE — Assessment & Plan Note (Signed)
-  Urinalysis consistent with signs of infection.  Follow-up urine culture -Continue Rocephin for now -Will de-escalate pending further goals of care

## 2019-08-22 NOTE — Progress Notes (Signed)
ANTICOAGULATION CONSULT NOTE - Initial Consult  Pharmacy Consult for heparin Indication: atrial fibrillation  Allergies  Allergen Reactions  . Zantac [Ranitidine Hcl] Hives    Patient Measurements:   Heparin Dosing Weight: 57 kg  Vital Signs: Temp: 98.8 F (37.1 C) (07/25 2205) Temp Source: Axillary (07/25 2205) BP: 103/72 (07/26 0229) Pulse Rate: 91 (07/26 0229)  Labs: Recent Labs    08/22/2019 2237 August 22, 2019 2345 08/22/19 0034  HGB 12.5  --   --   HCT 40.7  --   --   PLT 180  --   --   APTT  --  31  --   LABPROT  --  14.4  --   INR  --  1.2  --   CREATININE 2.83*  --   --   TROPONINIHS 40*  --  39*    CrCl cannot be calculated (Unknown ideal weight.).   Medical History: Past Medical History:  Diagnosis Date  . Atrial fibrillation (HCC)   . Bilateral swelling of feet   . Dysrhythmia    a-fib  . E-coli UTI    History of in 11/ 2012 admission  . Esophageal diverticulum   . Gout   . High blood pressure   . Thyroid disease     Medications:  See medication history  Assessment: 86 yo lady on coumadin for afib.  INR 1.2.  MD wishes to start heparin.  Hg 12.5, PTLC 180 Goal of Therapy:  Heparin level 0.3-0.7 units/ml Monitor platelets by anticoagulation protocol: Yes   Plan:  Heparin drip at 700 units/hr no bolus Check heparin level 6-8 hours after start Daily HL and CBC while on heparin Monitor for bleeding complications  Carrie Morris 08/22/2019,3:48 AM

## 2019-08-22 NOTE — H&P (Signed)
History and Physical    Carrie Morris QQV:[id number] DOB: 06/18/1921 DOA: 08/15/2019  PCP: Chilton Greathouse, MD  Patient coming from: Home  I have personally briefly reviewed patient's old medical records in [facility] Link  Chief Complaint: Lethargy  HPI: Carrie Morris is a 86 y.o. female with medical history significant of A.Fib on coumadin, hypothyroidism.    Patient presents to the ED for evaluation of poor p.o. intake, tachycardia, lethargy and declining health.  Patient resides at home with her son.  At baseline he usually has to move her in a wheelchair when she has to go to the bathroom or to help feed her.  She is usually able to carry on a conversation.  Son has noticed in the last couple of weeks however her health has been declining.  She has not been eating or drinking as much.  In the last couple days she has not really had much if anything to eat or drink at all.  She has been not speaking clearly.  She has been moaning and appears to be uncomfortable.  Son checked a pulse ox today, noted HR 130, called EMS.  EMS noted rapid A.Fib, They cardioverted her on route with 200 then 300 joules.  Her rate decreased however she remained in atrial fibrillation and did not convert.  ED Course: CT head neg, INR 1.2.  Lactate 6.6 given 2L IVF in ED, repeat lactate 3.2  Creat 2.8 (baseline 0.x), BUN 39.  Trop 40, BNP 1277.  CXR neg  WBC 11.3k.  UA with >50 WBC, mod LE, Mod HGB, many bacteria, but 21-50 squam epi.  Started on rocephin for suspected UTI.  CT abd/pelvis: neg for findings other than stool ball in rectum.   Review of Systems: Unable to perform due to AMS  Past Medical History:  Diagnosis Date   Atrial fibrillation (HCC)    Bilateral swelling of feet    Dysrhythmia    a-fib   E-coli UTI    History of in 11/ 2012 admission   Esophageal diverticulum    Gout    High blood pressure    Thyroid disease     Past Surgical History:  Procedure  Laterality Date   BREAST SURGERY     benign cyst removed   TRANSTHORACIC ECHOCARDIOGRAM  November 2012   EF 60-65%. Normal LV size and function. Mild mitral prolapse and mild regurgitation. Mild LA dilation.     reports that she has never smoked. She has never used smokeless tobacco. She reports that she does not drink alcohol and does not use drugs.  Allergies  Allergen Reactions   Zantac [Ranitidine Hcl] Hives    Family History  Problem Relation Age of Onset   Heart attack Father        He was in his 36's     Prior to Admission medications   Medication Sig Start Date End Date Taking? Authorizing Provider  diltiazem (CARDIZEM CD) 300 MG 24 hr capsule Take 1 capsule (300 mg total) by mouth daily. 12/28/13 08/21/28 Yes Marykay Lex, MD  levothyroxine (SYNTHROID, LEVOTHROID) 75 MCG tablet Take 75 mcg by mouth daily.     Yes [provider]  warfarin (COUMADIN) 5 MG tablet Take 2.5-5 mg by mouth See admin instructions. Take 1 tablet on Monday and Friday then take 1/2 tablet all the other days   Yes [provider]    Physical Exam: Vitals:   08/22/19 3329 08/22/19 0116 08/22/19 5188 08/22/19 4166  BP: (!) 112/92 110/73 (!) 130/113 103/72  Pulse: 76 86 94 91  Resp: 20 19 20 19   Temp:      TempSrc:      SpO2: 95% 99% 91% 94%    Constitutional: Lethargic, altered Eyes: PERRL, lids and conjunctivae normal ENMT: Mucous membranes are dry. Posterior pharynx clear of any exudate or lesions.Normal dentition.  Neck: normal, supple, no masses, no thyromegaly Respiratory: clear to auscultation bilaterally, no wheezing, no crackles. Normal respiratory effort. No accessory muscle use.  Cardiovascular: Regular rate and rhythm, no murmurs / rubs / gallops. No extremity edema. 2+ pedal pulses. No carotid bruits.  Abdomen: no tenderness, no masses palpated. No hepatosplenomegaly. Bowel sounds positive.  Musculoskeletal: no clubbing / cyanosis. No joint deformity upper  and lower extremities. Good ROM, no contractures. Normal muscle tone.  Skin: No obvious cellulitis or open wounds to legs, sides.  RN says they will check for decubitus on sacrum shortly. Neurologic: MAE, lethargic,  Psychiatric: Lethargic   Labs on Admission: I have personally reviewed following labs and imaging studies  CBC: Recent Labs  Lab 09-06-2019 2237  WBC 11.3*  NEUTROABS 10.2*  HGB 12.5  HCT 40.7  MCV 109.4*  PLT 180   Basic Metabolic Panel: Recent Labs  Lab 09-06-2019 2237  NA 140  K 4.7  CL 106  CO2 16*  GLUCOSE 201*  BUN 39*  CREATININE 2.83*  CALCIUM 8.3*   GFR: CrCl cannot be calculated (Unknown ideal weight.). Liver Function Tests: Recent Labs  Lab 09-06-2019 2237  AST 207*  ALT 57*  ALKPHOS 72  BILITOT 2.9*  PROT 5.3*  ALBUMIN 1.8*   No results for input(s): LIPASE, AMYLASE in the last 168 hours. No results for input(s): AMMONIA in the last 168 hours. Coagulation Profile: Recent Labs  Lab 09-06-2019 2345  INR 1.2   Cardiac Enzymes: No results for input(s): CKTOTAL, CKMB, CKMBINDEX, TROPONINI in the last 168 hours. BNP (last 3 results) No results for input(s): PROBNP in the last 8760 hours. HbA1C: No results for input(s): HGBA1C in the last 72 hours. CBG: No results for input(s): GLUCAP in the last 168 hours. Lipid Profile: No results for input(s): CHOL, HDL, LDLCALC, TRIG, CHOLHDL, LDLDIRECT in the last 72 hours. Thyroid Function Tests: No results for input(s): TSH, T4TOTAL, FREET4, T3FREE, THYROIDAB in the last 72 hours. Anemia Panel: No results for input(s): VITAMINB12, FOLATE, FERRITIN, TIBC, IRON, RETICCTPCT in the last 72 hours. Urine analysis:    Component Value Date/Time   COLORURINE AMBER (A) 02-07-19 2237   APPEARANCEUR CLOUDY (A) 02-07-19 2237   LABSPEC 1.019 02-07-19 2237   PHURINE 5.0 02-07-19 2237   GLUCOSEU NEGATIVE 02-07-19 2237   HGBUR MODERATE (A) 02-07-19 2237   BILIRUBINUR SMALL (A) 02-07-19 2237    KETONESUR 5 (A) 02-07-19 2237   PROTEINUR 100 (A) 02-07-19 2237   UROBILINOGEN 1.0 03/14/2012 2104   NITRITE NEGATIVE 02-07-19 2237   LEUKOCYTESUR MODERATE (A) 02-07-19 2237    Radiological Exams on Admission: CT ABDOMEN PELVIS WO CONTRAST  Result Date: 08/22/2019 CLINICAL DATA:  Sepsis EXAM: CT ABDOMEN AND PELVIS WITHOUT CONTRAST TECHNIQUE: Multidetector CT imaging of the abdomen and pelvis was performed following the standard protocol without IV contrast. COMPARISON:  11/27/2010 FINDINGS: LOWER CHEST: Small right pleural effusion with basilar atelectasis. Large hiatal hernia. There is atherosclerotic calcification of the mitral valve. HEPATOBILIARY: Normal hepatic contours. No intra- or extrahepatic biliary dilatation. There is cholelithiasis without acute inflammation. PANCREAS: Normal pancreas. No ductal dilatation or peripancreatic  fluid collection. SPLEEN: Normal. ADRENALS/URINARY TRACT: The adrenal glands are normal. No hydronephrosis, nephroureterolithiasis or solid renal mass. The urinary bladder is normal for degree of distention STOMACH/BOWEL: There is a large hiatal hernia that contains segment of small bowel. The stomach is mostly decompressed. No small bowel dilatation or inflammation. There is a large stool ball in the rectum. Normal appendix. VASCULAR/LYMPHATIC: There is calcific atherosclerosis of the abdominal aorta. No lymphadenopathy. REPRODUCTIVE: Normal uterus. No adnexal mass. MUSCULOSKELETAL. Lumbar levoscoliosis. No acute abnormality. Multilevel advanced facet arthrosis and vertebral osteophytosis. OTHER: None. IMPRESSION: 1. No acute abnormality of the abdomen or pelvis. 2. Large hiatal hernia. 3. Large stool ball in the rectum. 4. Small right pleural effusion with basilar atelectasis. 5. Aortic Atherosclerosis (ICD10-I70.0). Electronically Signed   By: Deatra Robinson M.D.   On: 08/22/2019 03:11   CT Head Wo Contrast  Result Date: August 24, 2019 CLINICAL DATA:  Recent  mental status change EXAM: CT HEAD WITHOUT CONTRAST TECHNIQUE: Contiguous axial images were obtained from the base of the skull through the vertex without intravenous contrast. COMPARISON:  07/05/2011 FINDINGS: Brain: Chronic atrophic and white matter ischemic changes are again seen and stable. No findings to suggest acute hemorrhage, acute infarction or space-occupying mass lesion are seen. Vascular: No hyperdense vessel or unexpected calcification. Skull: Normal. Negative for fracture or focal lesion. Sinuses/Orbits: No acute finding. Other: None. IMPRESSION: Chronic atrophic and ischemic changes.  No acute abnormality noted. Electronically Signed   By: Alcide Clever M.D.   On: 2019-08-24 23:40   DG Chest Port 1 View  Result Date: Aug 24, 2019 CLINICAL DATA:  86 year old female with weakness. EXAM: PORTABLE CHEST 1 VIEW COMPARISON:  Chest radiograph dated 07/05/2014. FINDINGS: Probable trace bilateral pleural effusions and bibasilar atelectasis. No lobar consolidation or pneumothorax. Stable cardiomegaly. Atherosclerotic calcification of the aorta. The previously seen hiatal hernia is less conspicuous on today's exam. Osteopenia. No acute osseous pathology. IMPRESSION: 1. Probable trace bilateral pleural effusions and bibasilar atelectasis. 2. Stable cardiomegaly. Electronically Signed   By: Elgie Collard M.D.   On: 08/24/2019 22:54    EKG: Independently reviewed.  Assessment/Plan Principal Problem:   Acute encephalopathy Active Problems:   Chronic atrial fibrillation (HCC)   Hypothyroidism   AKI (acute kidney injury) (HCC)   Acute lower UTI   Lactic acidosis   Transaminitis   Constipation    1. Acute encephalopathy - 1. Suspect delirium due to acute medical conditions. 2. If no rapid improvement in pt condition: would get pal care involved. 3. Son does note she is much more active already since getting IVF in ED though. 4. Spoke with Son: pt would wish to be a DNR it sounds like. 2. AKF  - 1. Suspect dehydration / ATN 2. IVF: 2L bolus in ED + 100 cc/hr isotonic bicarb 3. Repeat BMP in AM 4. Strict intake and output 3. Lactic acidosis - 1. Dehydration vs sepsis 2. CT abd/pelvis neg 3. Seems to be responding to IVF as noted above 4. Check serial lactates 4. ? UTI - 1. Empiric rocephin 2. UCx, BCx pending 5. A.Fib RVR - 1. Now A.Fib rate controlled after IVF 2. Tele monitor 3. A.Fib is chronic, looks like she is on chronic coumadin for this at baseline 4. INR 1.2 5. Putting on heparin gtt to bridge for the moment as she wont be taking POs until mental status improves 6. Transaminitis - 1. Repeat LFTs pending this morning 2. Cholelithiasis but no acute inflammation nor biliary duct dilation on CT scan 3. If  LFTs rise further then may need RUQ [place]. 7. Constipation - 1. Dulcolax PR x1 for stool ball 8. Hypothyroidism - 1. Checking TSH 2. Ordering IV synthroid until she can resume POs. 1. But per protocol wont be started for another couple of days.  DVT prophylaxis: Heparin gtt Code Status: DNR/DNI - discussed with son Family Communication: Son at bedside Disposition Plan: TBD Consults called: None Admission status: Admit to inpatient  Severity of Illness: The appropriate patient status for this patient is INPATIENT. Inpatient status is judged to be reasonable and necessary in order to provide the required intensity of service to ensure the patient's safety. The patient's presenting symptoms, physical exam findings, and initial radiographic and laboratory data in the context of their chronic comorbidities is felt to place them at high risk for further clinical deterioration. Furthermore, it is not anticipated that the patient will be medically stable for discharge from the hospital within 2 midnights of admission. The following factors support the patient status of inpatient.   IP status due to AKF with creat 2.8 up from baseline of 1.  Also has AMS to point where  she cannot take POs.   * I certify that at the point of admission it is my clinical judgment that the patient will require inpatient hospital care spanning beyond 2 midnights from the point of admission due to high intensity of service, high risk for further deterioration and high frequency of surveillance required.*    Krystena Reitter M. DO [facility]  How to contact the [facility] Attending or Consulting provider 7A - 7P or covering provider during after hours 7P -7A, for this patient?  1. Check the care team in [facility] and look for a) attending/consulting [facility] provider listed and b) the [facility] team listed 2. Log into [email or web address]  Amion Physician Scheduling and messaging for groups and whole hospitals  On call and physician scheduling software for group practices, residents, hospitalists and other medical providers for call, clinic, rotation and shift schedules. On[employer] is a hospital-wide system for scheduling doctors and paging doctors on call. EasyPlot is for scientific plotting and data analysis.  [email or web address]  and use [place]'s universal password to access. If you do not have the password, please contact the hospital operator.  3. Locate the [facility] provider you are looking for under [facility] and page to a number that you can be directly reached. 4. If you still have difficulty reaching the provider, please page the [facility] (Director on Call) for the Hospitalists listed on amion for assistance.  08/22/2019, 4:04 AM

## 2019-08-22 NOTE — Assessment & Plan Note (Signed)
-   doubtful she will resume orals; starting IV synthroid also likely futile; patient would benefit from hospice at this time - await further GOC discussions with [facility]

## 2019-08-23 DIAGNOSIS — R6520 Severe sepsis without septic shock: Secondary | ICD-10-CM

## 2019-08-23 DIAGNOSIS — R627 Adult failure to thrive: Secondary | ICD-10-CM

## 2019-08-23 DIAGNOSIS — A419 Sepsis, unspecified organism: Principal | ICD-10-CM

## 2019-08-23 DIAGNOSIS — Z515 Encounter for palliative care: Secondary | ICD-10-CM

## 2019-08-23 MED ORDER — LACTATED RINGERS IV BOLUS
500.0000 mL | Freq: Once | INTRAVENOUS | Status: AC
  Administered 2019-08-23: 500 mL via INTRAVENOUS

## 2019-08-23 MED ORDER — LORAZEPAM 2 MG/ML IJ SOLN: 1.0000 mg | INTRAMUSCULAR | Status: DC | PRN

## 2019-08-23 MED ORDER — GLYCOPYRROLATE 0.2 MG/ML IJ SOLN: 0.2000 mg | INTRAMUSCULAR | Status: DC | PRN

## 2019-08-23 MED ORDER — MORPHINE SULFATE (PF) 2 MG/ML IV SOLN: 1.0000 mg | INTRAVENOUS | Status: DC | PRN

## 2019-08-23 MED ORDER — MORPHINE BOLUS VIA INFUSION
1.0000 mg | INTRAVENOUS | Status: DC | PRN
  Filled 2019-08-23: qty 2

## 2019-08-23 MED ORDER — MORPHINE 100MG IN NS 100ML (1MG/ML) PREMIX INFUSION
1.0000 mg/h | INTRAVENOUS | Status: DC
  Administered 2019-08-23: 1 mg/h via INTRAVENOUS
  Filled 2019-08-23: qty 100

## 2019-08-26 LAB — CULTURE, BLOOD (ROUTINE X 2)
Culture: NO GROWTH
Culture: NO GROWTH

## 2019-08-28 NOTE — Progress Notes (Signed)
Patient transported to morgue after preparation of body.  Security notified and remains secured as per policy.

## 2019-08-28 NOTE — Progress Notes (Signed)
Respirations ceased / pt pulseless / UTA apical pulse.  TOD = 1249.  Dyke Brackett, and myself pronounced patient and notified MD.

## 2019-08-28 NOTE — Death Summary Note (Signed)
DEATH SUMMARY   Patient Details  Name: Carrie Morris MRN: [id number] DOB: 06/14/1921  Admission/Discharge Information   Admit Date:  08/10/2019  Date of Death: Date of Death: 03-14-19  Time of Death: Time of Death: 1249  Length of Stay: 1  Referring Physician: Chilton GreathouseAvva, Ravisankar, MD   Reason(s) for Hospitalization  Acute metabolic encephalopathy/ Lethargy  Diagnoses  Preliminary cause of death: Severe sepsis/shock with lactic acidosis secondary to UTI Secondary Diagnoses (including complications and co-morbidities):  Goals of care Lactic acidosis Chronic atrial fibrillation with RVR Acute metabolic encephalopathy Acute kidney injury on chronic kidney disease, stage IIIa Transaminitis Elevated BNP Hypothyroidism Constipation Severe malnutrition  Brief Hospital Course (including significant findings, care, treatment, and services provided and events leading to death)  Carrie RightRuby S Morris is a 86 y.o. year old female with a past medical history of afib (on Coumadin at home), hypothyroidism, hypertension who presented to the hospital after her son noticed that she has become progressively less responsive at home. He endorses that she has had a tremendous physical decline over the past several months.  She continues to not eat and has ongoing weight loss.  She has become frail and cachectic.  She is completely dependent on all ADLs and her son has been caring for her at home.  He also states that he was planning on retiring this month to further continue caring for her.  He says that her speech has been unintelligible for the last couple weeks because of her ongoing decline.  Despite stating, she has been declining, when we discussed her overall poor state of health, poor prognosis, and that she seems to be approaching end-of-life, he was still insistent on aggressive medical care.  On admission, CODE STATUS was discussed and he did change patient's CODE STATUS to DNR/DNI.  He has a brother and  [place] whom he says wants to come and see the patient and have further family discussions and goals of care discussions.  He is amenable to meeting with palliative care to also start discussing next steps.  On work-up, she was found to be septic from presumed UTI and in A. fib with RVR.  She was fluid resuscitated, started on antibiotics, and started on a heparin drip due to subtherapeutic INR.  We discussed discontinuing the heparin drip in the ER, but son was also insistent on continuing treatment for now.  Upon my evaluation of patient this morning, had a long conversation with the patient's son at bedside. She was noted to have AF with RVR (HR 140s), with BP of 60/52.  Discussed making the patient comfort care with the patient's son and he was on board as he did not want to see his mother suffer or be in pain.  Placed patient on morphine drip along with Ativan.  Palliative care was also consulted, and added Robinul.  Patient passed at 1249 today.  Assessment/Plan Severe sepsis/shock secondary to UTI -Present on admission -Patient presented with acute metabolic encephalopathy, lethargy, lactic acidosis with elevated LFTs, acute kidney injury, atrial fibrillation with RVR, tachycardia, with soft BP -Patient was given IV fluids however transition to DNR therefore no pressors -UA showed many bacteria, WBC >50, leukocytes -Urine culture shows multiple species, suggest recollection -Blood cultures show no growth to date -Chest x-ray showed probable trace bilateral pleural effusions, bibasilar atelectasis -Was placed on IV fluids as well as the ceftriaxone  Goals of care -Son at bedside -Patient made DNR -Overnight patient continued to deteriorate, blood pressures have been extremely  low, patient with A. fib RVR -Discussed comfort care measures with son, and he was on board-he does not want his mother to suffer -Place patient on morphine drip along with Ativan -Palliative care  consulted and appreciated, added Robinul  Lactic acidosis -Secondary to the above, was noted to be 6.6 on admission -Improved down to 3.1  Chronic atrial fibrillation with RVR -Patient noted to have subtherapeutic INR and was placed on heparin drip which is now been discontinued as patient has been transitioned to comfort measures  Acute metabolic encephalopathy -Likely secondary to sepsis and urinary tract infection -CT head showed chronic atrophic and ischemic changes.  No acute normality noted -See discussions as above  Acute kidney injury on chronic kidney disease, stage IIIa -Creatinine on admission 2.83 -No previous creatinine noted since 2016 at which point it was 1.04 -Suspect secondary to dehydration and poor oral intake  Transaminitis -Secondary to the above  Elevated BNP -No recent echocardiogram on file  -Would suspect a degree of CHF or however cannot truly diagnose as a last echocardiogram noted was in 2012 which noted a normal EF  Hypothyroidism -Placed on IV Synthroid however this was discontinued  Constipation -CT abdomen and pelvis showed severe fecal impaction all this is  Severe malnutrition -Albumin 1.8 -Patient has not had good appetite and has had weight loss as per son   Pertinent Labs and Studies  Significant Diagnostic Studies CT ABDOMEN PELVIS WO CONTRAST  Result Date: 08/22/2019 CLINICAL DATA:  Sepsis EXAM: CT ABDOMEN AND PELVIS WITHOUT CONTRAST TECHNIQUE: Multidetector CT imaging of the abdomen and pelvis was performed following the standard protocol without IV contrast. COMPARISON:  11/27/2010 FINDINGS: LOWER CHEST: Small right pleural effusion with basilar atelectasis. Large hiatal hernia. There is atherosclerotic calcification of the mitral valve. HEPATOBILIARY: Normal hepatic contours. No intra- or extrahepatic biliary dilatation. There is cholelithiasis without acute inflammation. PANCREAS: Normal pancreas. No ductal dilatation or  peripancreatic fluid collection. SPLEEN: Normal. ADRENALS/URINARY TRACT: The adrenal glands are normal. No hydronephrosis, nephroureterolithiasis or solid renal mass. The urinary bladder is normal for degree of distention STOMACH/BOWEL: There is a large hiatal hernia that contains segment of small bowel. The stomach is mostly decompressed. No small bowel dilatation or inflammation. There is a large stool ball in the rectum. Normal appendix. VASCULAR/LYMPHATIC: There is calcific atherosclerosis of the abdominal aorta. No lymphadenopathy. REPRODUCTIVE: Normal uterus. No adnexal mass. MUSCULOSKELETAL. Lumbar levoscoliosis. No acute abnormality. Multilevel advanced facet arthrosis and vertebral osteophytosis. OTHER: None. IMPRESSION: 1. No acute abnormality of the abdomen or pelvis. 2. Large hiatal hernia. 3. Large stool ball in the rectum. 4. Small right pleural effusion with basilar atelectasis. 5. Aortic Atherosclerosis (ICD10-I70.0). Electronically Signed   By: Deatra Robinson M.D.   On: 08/22/2019 03:11   CT Head Wo Contrast  Result Date: 08/10/2019 CLINICAL DATA:  Recent mental status change EXAM: CT HEAD WITHOUT CONTRAST TECHNIQUE: Contiguous axial images were obtained from the base of the skull through the vertex without intravenous contrast. COMPARISON:  07/05/2011 FINDINGS: Brain: Chronic atrophic and white matter ischemic changes are again seen and stable. No findings to suggest acute hemorrhage, acute infarction or space-occupying mass lesion are seen. Vascular: No hyperdense vessel or unexpected calcification. Skull: Normal. Negative for fracture or focal lesion. Sinuses/Orbits: No acute finding. Other: None. IMPRESSION: Chronic atrophic and ischemic changes.  No acute abnormality noted. Electronically Signed   By: Alcide Clever M.D.   On: 08/27/2019 23:40   DG Chest Port 1 View  Result Date: 08/26/2019 CLINICAL  DATA:  86 year old female with weakness. EXAM: PORTABLE CHEST 1 VIEW COMPARISON:  Chest  radiograph dated 07/05/2014. FINDINGS: Probable trace bilateral pleural effusions and bibasilar atelectasis. No lobar consolidation or pneumothorax. Stable cardiomegaly. Atherosclerotic calcification of the aorta. The previously seen hiatal hernia is less conspicuous on today's exam. Osteopenia. No acute osseous pathology. IMPRESSION: 1. Probable trace bilateral pleural effusions and bibasilar atelectasis. 2. Stable cardiomegaly. Electronically Signed   By: Elgie Collard M.D.   On: 08/18/2019 22:54    Microbiology Recent Results (from the past 240 hour(s))  Blood Culture (routine x 2)     Status: None (Preliminary result)   Collection Time: 08/18/2019 10:20 PM   Specimen: BLOOD  Result Value Ref Range Status   Specimen Description BLOOD RIGHT ANTECUBITAL  Final   Special Requests   Final    BOTTLES DRAWN AEROBIC AND ANAEROBIC Blood Culture results may not be optimal due to an inadequate volume of blood received in culture bottles   Culture   Final    NO GROWTH 2 DAYS Performed at [facility], [address] [place], [place] [address]    Report Status PENDING  Incomplete  Blood Culture (routine x 2)     Status: None (Preliminary result)   Collection Time: 08/19/2019 10:50 PM   Specimen: BLOOD  Result Value Ref Range Status   Specimen Description BLOOD LEFT ANTECUBITAL  Final   Special Requests   Final    BOTTLES DRAWN AEROBIC AND ANAEROBIC Blood Culture results may not be optimal due to an inadequate volume of blood received in culture bottles   Culture   Final    NO GROWTH 2 DAYS Performed at [facility], [address], [place], [place] [address]    Report Status PENDING  Incomplete  Urine culture     Status: Abnormal   Collection Time: 07/29/2019 11:23 PM   Specimen: In/Out Cath Urine  Result Value Ref Range Status   Specimen Description IN/OUT CATH URINE  Final   Special Requests   Final    NONE Performed at [facility], [address] [place], [place]  [address]    Culture MULTIPLE SPECIES PRESENT, SUGGEST RECOLLECTION (A)  Final   Report Status 08/22/2019 FINAL  Final  SARS Coronavirus 2 by RT PCR (hospital order, performed in [facility] hospital lab) Nasopharyngeal Nasopharyngeal Swab     Status: None   Collection Time: 08/22/19 12:42 AM   Specimen: Nasopharyngeal Swab  Result Value Ref Range Status   SARS Coronavirus 2 NEGATIVE NEGATIVE Final    Comment: (NOTE) SARS-CoV-2 target nucleic acids are NOT DETECTED.  The SARS-CoV-2 RNA is generally detectable in upper and lower respiratory specimens during the acute phase of infection. The lowest concentration of SARS-CoV-2 viral copies this assay can detect is 250 copies / mL. A negative result does not preclude SARS-CoV-2 infection and should not be used as the sole basis for treatment or other patient management decisions.  A negative result may occur with improper specimen collection / handling, submission of specimen other than nasopharyngeal swab, presence of viral mutation(s) within the areas targeted by this assay, and inadequate number of viral copies (<250 copies / mL). A negative result must be combined with clinical observations, patient history, and epidemiological information.  Fact Sheet for Patients:   BoilerBrush.com.cy  Fact Sheet for Healthcare Providers: https://pope.com/  This test is not yet approved or  cleared by the [place] FDA and has been authorized for detection  and/or diagnosis of SARS-CoV-2 by FDA under an Emergency Use Authorization (EUA).  This EUA will remain in effect (meaning this test can be used) for the duration of the COVID-19 declaration under Section 564(b)(1) of the Act, 21 U.S.C. section 360bbb-3(b)(1), unless the authorization is terminated or revoked sooner.  Performed at [facility], [address], [place], [place] [address]     Lab Basic Metabolic Panel: Recent Labs   Lab 08/14/2019 2237 08/22/19 0348 08/22/19 1615  NA 140 142 143  K 4.7 4.3 3.9  CL 106 109 107  CO2 16* 20* 18*  GLUCOSE 201* 118* 74  BUN 39* 38* 40*  CREATININE 2.83* 2.75* 2.43*  CALCIUM 8.3* 8.1* 8.0*   Liver Function Tests: Recent Labs  Lab 08/01/2019 2237 08/22/19 0348 08/22/19 1615  AST 207* 368* 332*  ALT 57* 97* 116*  ALKPHOS 72 72 68  BILITOT 2.9* 1.9* 1.9*  PROT 5.3* 5.3* 5.2*  ALBUMIN 1.8* 2.0* 1.9*   No results for input(s): LIPASE, AMYLASE in the last 168 hours. No results for input(s): AMMONIA in the last 168 hours. CBC: Recent Labs  Lab 08/10/2019 2237 08/22/19 0348  WBC 11.3* 12.0*  NEUTROABS 10.2*  --   HGB 12.5 13.1  HCT 40.7 41.6  MCV 109.4* 108.1*  PLT 180 129*   Cardiac Enzymes: No results for input(s): CKTOTAL, CKMB, CKMBINDEX, TROPONINI in the last 168 hours. Sepsis Labs: Recent Labs  Lab 08/17/2019 2237 08/22/2019 2237 08/22/19 0034 08/22/19 0200 08/22/19 0348 08/22/19 0350 08/22/19 1214  PROCALCITON  --   --   --   --  0.23  --   --   WBC 11.3*  --   --   --  12.0*  --   --   LATICACIDVEN 6.6*   < > 6.4* 3.2*  --  3.4* 3.1*   < > = values in this interval not displayed.    Procedures/Operations  none   Edsel Petrin August 27, 2019, 2:39 PM

## 2019-08-28 NOTE — Progress Notes (Addendum)
   07/31/2019 0755  Assess: MEWS Score  Temp 98 F (36.7 C)  BP (!) 42/23  Pulse Rate 50  ECG Heart Rate (!) 131  Resp 19  SpO2 (!) 88 %  O2 Device Nasal Cannula  O2 Flow Rate (L/min) 4 L/min  Assess: MEWS Score  MEWS Temp 0  MEWS Systolic 3  MEWS Pulse 3  MEWS RR 0  MEWS LOC 1  MEWS Score 7  MEWS Score Color Red  Assess: if the MEWS score is Yellow or Red  Were vital signs taken at a resting state? Yes  Focused Assessment No change from prior assessment  Early Detection of Sepsis Score *See Row Information* Low  MEWS guidelines implemented *See Row Information* No, previously red, continue vital signs every 4 hours (MD aware)  Treat  MEWS Interventions Other (Comment) (MD aware)  Pain Scale PAINAD  Breathing 0  Negative Vocalization 0  Facial Expression 0  Body Language 0  Consolability 0  PAINAD Score 0  Take Vital Signs  Increase Vital Sign Frequency  Red: Q 1hr X 4 then Q 4hr X 4, if remains red, continue Q 4hrs  Escalate  MEWS: Escalate Red: discuss with charge nurse/RN and provider, consider discussing with RRT   MD aware of declining status.  MSO4 gtt ordered.  At time of assessment, patient cool to touch.  No corneal reflex noted.  Pupils pinpoint and non-responsive to light.  No response to painful stimulus.

## 2019-08-28 NOTE — Consult Note (Signed)
Consultation Note Date: 2019-09-13   Patient Name: Carrie Morris  DOB: [birth year]  MRN: [id number]  Age / Sex: 86 y.o., female  PCP: Prince Solian, MD Referring Physician: Cristal Ford, DO  Reason for Consultation: Establishing goals of care  HPI/Patient Profile: 86 y.o. female  with past medical history of hypothyroidism, hypertension, afib on coumadin, declining status and failure to thrive in the past few months admitted on 07/30/2019 with decreased responsiveness, poor oral intake, ongoing weight loss. Patient found to be septic from presumed UTI and with afib RVR. Fluid resuscitation, IV antibiotics and heparin gtt initiated. Decision made for DNR/DNI. Patient continues to clinical decline with blood pressure 43/30 prior to PMT visit. Palliative medicine consultation for terminal care.   Clinical Assessment and Goals of Care:  I have reviewed medical records, discussed with Dr. Ree Kida and RN, and met with son Shanon Brow) at bedside to discuss goals of care/EOL care.   Introduced role of palliative medicine. Discussed his mother's decline in the last few months with failure to thrive. Shanon Brow has lived with her for 15+ years. One other son living in [place].   Shanon Brow shares his understanding that his mother will likely pass away today. He has a good understanding of diagnoses and poor prognosis. He is clear on his goals for comfort for her and feels she does look comfortable this morning on morphine infusion.   Discussed symptom management medications. Explained recommendation to discontinue interventions not aimed at comfort to allow comfort and dignity as she passes. Expressed concern with fluid overload if we continue aggressive IVF. Also continuing heparin will include frequent blood draws. Shanon Brow understands and agrees with medical recommendation to stop interventions not aimed at comfort. "Whatever you all think  is best." Shanon Brow does prefer to continue cardiac monitor and shares that he watched his father die on the monitor. This will not be uncomfortable for him.   David's brother is traveling from [place] this afternoon. He should be at [facility] by 1330.   Ms. Nadalyn is still on 9L of oxygen. Medically recommended decreasing oxygen once his brother arrives, explaining that this could prolong the process. Shanon Brow understands and agrees with titrating oxygen down once his brother arrives.   Shanon Brow declines chaplain support at this time.  Answered all questions. PMT contact information given and encouraged Shanon Brow to call with questions or concerns this afternoon.   Updated RN Marge on my discussion with son. Comfort tray to room.    SUMMARY OF RECOMMENDATIONS    Transition to comfort measures only. Discontinue interventions not aimed at comfort including IVF. Son does prefer to continue cardiac monitor at this time.   DNR/DNI  Symptom management--see below  Unrestricted visitor access as she is nearing EOL. Second son to arrive to [facility] by this afternoon.   Encouraged son/RN to titrate 9L oxygen down once other son arrives, as this could prolong the process. Son understands and agrees with this plan.   Anticipate hospital demise, likely today.   Code Status/Advance Care Planning:  DNR  Symptom Management:   Morphine 47m/hr continuous infusion  RN may bolus morphine via infusion 1-285mIV q1520mprn breakthrough pain/dyspnea/air hunger/tachypnea  Robinul 0.2mg57m q4h prn secretions  Ativan 1mg 55mq2h prn anxiety  Palliative Prophylaxis:   Aspiration, Delirium Protocol, Frequent Pain Assessment, Oral Care and Turn Reposition  Additional Recommendations (Limitations, Scope, Preferences):  Full Comfort Care  Psycho-social/Spiritual:   Desire for further Chaplaincy support:yes  Additional Recommendations: Caregiving  Support/Resources and Compassionate Wean Education  Prognosis:   Likely  hours. Anticipate she will pass today with hypotension  Discharge Planning: Anticipated Hospital Death      Primary Diagnoses: Present on Admission: . (Resolved) Adult failure to thrive . Chronic atrial fibrillation (HCC) [place] UTI . AKI (acute kidney injury) (HCC) [place] acidosis . Acute encephalopathy . Transaminitis . Hypothyroidism . Constipation   I have reviewed the medical record, interviewed the patient and family, and examined the patient. The following aspects are pertinent.  Past Medical History:  Diagnosis Date  . Atrial fibrillation (HCC) [place] Bilateral swelling of feet   . Dysrhythmia    a-fib  . E-coli UTI    History of in 11/ 2012 admission  . Esophageal diverticulum   . Gout   . High blood pressure   . Thyroid disease    Social History   Socioeconomic History  . Marital status: Widowed    Spouse name: Not on file  . Number of children: Not on file  . Years of education: Not on file  . Highest education level: Not on file  Occupational History  . Not on file  Tobacco Use  . Smoking status: Never Smoker  . Smokeless tobacco: Never Used  Substance and Sexual Activity  . Alcohol use: No  . Drug use: No  . Sexual activity: Never  Other Topics Concern  . Not on file  Social History Narrative  . Not on file   Social Determinants of Health   Financial Resource Strain:   . Difficulty of Paying Living Expenses:   Food Insecurity:   . Worried About RunniCharity fundraiserhe Last Year:   . Ran OArboriculturisthe Last Year:   Transportation Needs:   . Lack Film/video editorical):   . LacMarland Kitchen of Transportation (Non-Medical):   Physical Activity:   . Days of Exercise per Week:   . Minutes of Exercise per Session:   Stress:   . Feeling of Stress :   Social Connections:   . Frequency of Communication with Friends and Family:   . Frequency of Social Gatherings with Friends and Family:   . Attends Religious Services:   . Active  Member of Clubs or Organizations:   . Attends Club Archivistings:   . MarMarland Kitchental Status:    Family History  Problem Relation Age of Onset  . Heart attack Father        He was in his 60's 5'sheduled Meds: Continuous Infusions: . morphine 1 mg/hr (07/272021-08-26)   PRN Meds:.glycopyrrolate, LORazepam, morphine Medications Prior to Admission:  Prior to Admission medications   Medication Sig Start Date End Date Taking? Authorizing Provider  diltiazem (CARDIZEM CD) 300 MG 24 hr capsule Take 1 capsule (300 mg total) by mouth daily. 12/28/13 08/21/28 Yes HardiLeonie Man levothyroxine (SYNTHROID, LEVOTHROID) 75 MCG tablet Take 75 mcg by mouth daily.     Yes [provider]  warfarin (COUMADIN) 5 MG tablet Take  2.5-5 mg by mouth See admin instructions. Take 1 tablet on Monday and Friday then take 1/2 tablet all the other days   Yes [provider]   Allergies  Allergen Reactions  . Zantac [Ranitidine Hcl] Hives   Review of Systems  Unable to perform ROS: Acuity of condition    Physical Exam Vitals and nursing note reviewed.  Constitutional:      Appearance: She is cachectic. She is ill-appearing.  Cardiovascular:     Rate and Rhythm: Tachycardia present.  Pulmonary:     Effort: No tachypnea, accessory muscle usage or respiratory distress.     Breath sounds: Decreased breath sounds present.     Comments: Shallow, regular Abdominal:     Tenderness: There is no abdominal tenderness.  Skin:    General: Skin is cool and dry.  Neurological:     Mental Status: She is lethargic.    Vital Signs: BP (!) 42/23 (BP Location: Right Arm)   Pulse (!) 29   Temp 98 F (36.7 C) (Oral)   Resp 19   Wt 48.9 kg   SpO2 (!) 88%   BMI 20.37 kg/m  Pain Scale: PAINAD     SpO2: SpO2: (!) 88 % O2 Device:SpO2: (!) 88 % O2 Flow Rate: .O2 Flow Rate (L/min): 8 L/min  IO: Intake/output summary:   Intake/Output Summary (Last 24 hours) at 2019/09/13 1057 Last data  filed at September 13, 2019 0800 Gross per 24 hour  Intake 4331.72 ml  Output --  Net 4331.72 ml    LBM:   Baseline Weight: Weight: 48.9 kg Most recent weight: Weight: 48.9 kg     Palliative Assessment/Data: 10%     Time In: 1030 Time Out: 1105 Time Total: 63mn Greater than 50%  of this time was spent counseling and coordinating care related to the above assessment and plan.  Signed by:  MIhor Dow DNP, FNP-C Palliative Medicine Team  Phone: [phone]: [phone]  Please contact Palliative Medicine Team phone at [phone] questions and concerns.  For individual provider: See AShea Evans

## 2019-08-28 NOTE — Progress Notes (Signed)
PROGRESS NOTE    Carrie Morris  RWE:[id number] DOB: 1921-08-23 DOA: 09/16/19 PCP: Chilton Greathouse, MD   Brief Narrative:  HPI On 08/22/2019 by Dr. Lyda Perone Carrie Morris is a 86 y.o. female with medical history significant of A.Fib on coumadin, hypothyroidism.    Patient presents to the ED for evaluation of poor p.o. intake, tachycardia, lethargy and declining health. Patient resides at home with her son. At baseline he usually has to move her in a wheelchair when she has to go to the bathroom or to help feed her. She is usually able to carry on a conversation. Son has noticed in the last couple of weeks however her health has been declining. She has not been eating or drinking as much. In the last couple days she has not really had much if anything to eat or drink at all. She has been not speaking clearly. She has been moaning and appears to be uncomfortable.  Son checked a pulse ox today, noted HR 130, called EMS.  EMS noted rapid A.Fib, They cardioverted her on route with 200 then 300 joules. Her rate decreased however she remained in atrial fibrillation and did not convert.  Interim history Patient admitted with sepsis, AMS. Transitioning to comfort care today. Assessment & Plan   Severe sepsis/shock secondary to UTI -Present on admission -Patient presented with acute metabolic encephalopathy, lethargy, lactic acidosis with elevated LFTs, acute kidney injury, atrial fibrillation with RVR, tachycardia, with soft BP -Patient was given IV fluids however transition to DNR therefore no pressors -UA showed many bacteria, WBC >50, leukocytes -Urine culture shows multiple species, suggest recollection -Blood cultures show no growth to date -Chest x-ray showed probable trace bilateral pleural effusions, bibasilar atelectasis -Placed on IV fluids as well as the ceftriaxone  Goals of care -Son at bedside -Patient made DNR -Overnight patient continued to deteriorate, blood  pressures have been extremely low, patient with A. fib RVR -Discussed comfort care measures with son, and he was on board-he does not want his mother to suffer -Place patient on morphine drip along with Ativan -Palliative care consulted and appreciated, added Robinul  Lactic acidosis -Secondary to the above, was noted to be 6.6 on admission -Improved down to 3.1  Chronic atrial fibrillation with RVR -Patient noted to have subtherapeutic INR and was placed on heparin drip which is now been discontinued as patient has been transitioned to comfort measures  Acute metabolic encephalopathy -Likely secondary to sepsis and urinary tract infection -CT head showed chronic atrophic and ischemic changes.  No acute normality noted -See discussions as above  Acute kidney injury on chronic kidney disease, stage IIIa -Creatinine on admission 2.83 -No previous creatinine noted since 2016 at which point it was 1.04 -Suspect secondary to dehydration and poor oral intake  Transaminitis -Secondary to the above  Elevated BNP -No recent echocardiogram on file  -Would suspect a degree of CHF or however cannot truly diagnose as a last echocardiogram noted was in 2012 which noted a normal EF  Hypothyroidism -Placed on IV Synthroid however this was discontinued  Constipation -CT abdomen and pelvis showed severe fecal impaction all this is  Severe malnutrition -Albumin 1.8 -Patient has not had good appetite and has had weight loss as per son  DVT Prophylaxis  Heparin discontinued- comfort measures  Code Status: DNR  Family Communication: Son at bedside  Disposition Plan:  Status is: Inpatient  Remains inpatient appropriate because:Hemodynamically unstable and comfort care   Dispo: The patient is from: Home  Anticipated d/c is to: Hospital death              Anticipated d/c date is: 1 day              Patient currently is not medically stable to d/c.   Consultants Palliative  care  Procedures  None  Antibiotics   Anti-infectives (From admission, onward)   Start     Dose/Rate Route Frequency Ordered Stop   08/22/19 2200  cefTRIAXone (ROCEPHIN) 1 g in sodium chloride 0.9 % 100 mL IVPB  Status:  Discontinued        1 g 200 mL/hr over 30 Minutes Intravenous Every 24 hours 08/22/19 0409 08/25/2019 1054   07/30/2019 2245  cefTRIAXone (ROCEPHIN) 1 g in sodium chloride 0.9 % 100 mL IVPB        1 g 200 mL/hr over 30 Minutes Intravenous  Once 08/22/2019 2237 07/29/2019 2353      Subjective:   Carrie Morris seen and examined today.  Currently unresponsive.  Objective:   Vitals:   August 25, 2019 0825 08-25-19 0855 August 25, 2019 0925 Aug 25, 2019 1025  BP: (!) 43/31 (!) 46/36 (!) 41/25 (!) 43/30  Pulse: (!) 44 (!) 34 (!) 25   Resp: 16 17 18 17   Temp:      TempSrc:      SpO2: (!) 89% 90% 93%   Weight:        Intake/Output Summary (Last 24 hours) at 25-Aug-2019 1215 Last data filed at 2019-08-25 0800 Gross per 24 hour  Intake 4331.72 ml  Output --  Net 4331.72 ml   Filed Weights   08/22/19 2042  Weight: 48.9 kg    Exam  General: Well developed, acutely ill appearing, NAD  HEENT: NCAT, mucous membranes moist.   Cardiovascular: S1 S2 auscultated, irregular, tachycardic  Respiratory: diminished breath sounds  Abdomen: Soft, nontender, nondistended, + bowel sounds  Extremities: warm dry without cyanosis clubbing. Trace Edema in Upper ext/lower  Data Reviewed: I have personally reviewed following labs and imaging studies  CBC: Recent Labs  Lab 08/18/2019 2237 08/22/19 0348  WBC 11.3* 12.0*  NEUTROABS 10.2*  --   HGB 12.5 13.1  HCT 40.7 41.6  MCV 109.4* 108.1*  PLT 180 129*   Basic Metabolic Panel: Recent Labs  Lab 08/18/2019 2237 08/22/19 0348 08/22/19 1615  NA 140 142 143  K 4.7 4.3 3.9  CL 106 109 107  CO2 16* 20* 18*  GLUCOSE 201* 118* 74  BUN 39* 38* 40*  CREATININE 2.83* 2.75* 2.43*  CALCIUM 8.3* 8.1* 8.0*   GFR: CrCl cannot be calculated  (Unknown ideal weight.). Liver Function Tests: Recent Labs  Lab 08/05/2019 2237 08/22/19 0348 08/22/19 1615  AST 207* 368* 332*  ALT 57* 97* 116*  ALKPHOS 72 72 68  BILITOT 2.9* 1.9* 1.9*  PROT 5.3* 5.3* 5.2*  ALBUMIN 1.8* 2.0* 1.9*   No results for input(s): LIPASE, AMYLASE in the last 168 hours. No results for input(s): AMMONIA in the last 168 hours. Coagulation Profile: Recent Labs  Lab 07/28/2019 2345  INR 1.2   Cardiac Enzymes: No results for input(s): CKTOTAL, CKMB, CKMBINDEX, TROPONINI in the last 168 hours. BNP (last 3 results) No results for input(s): PROBNP in the last 8760 hours. HbA1C: No results for input(s): HGBA1C in the last 72 hours. CBG: Recent Labs  Lab 08/22/19 2048  GLUCAP 47*   Lipid Profile: No results for input(s): CHOL, HDL, LDLCALC, TRIG, CHOLHDL, LDLDIRECT in the last 72 hours. Thyroid Function Tests: Recent  Labs    08/22/19 0348  TSH 6.005*   Anemia Panel: No results for input(s): VITAMINB12, FOLATE, FERRITIN, TIBC, IRON, RETICCTPCT in the last 72 hours. Urine analysis:    Component Value Date/Time   COLORURINE AMBER (A) 08/20/2019 2237   APPEARANCEUR CLOUDY (A) 08/04/2019 2237   LABSPEC 1.019 07/30/2019 2237   PHURINE 5.0 08/17/2019 2237   GLUCOSEU NEGATIVE 08/20/2019 2237   HGBUR MODERATE (A) 08/08/2019 2237   BILIRUBINUR SMALL (A) 08/03/2019 2237   KETONESUR 5 (A) 08/27/2019 2237   PROTEINUR 100 (A) 08/15/2019 2237   UROBILINOGEN 1.0 03/14/2012 2104   NITRITE NEGATIVE 08/13/2019 2237   LEUKOCYTESUR MODERATE (A) 08/26/2019 2237   Sepsis Labs: @LABRCNTIP (procalcitonin:4,lacticidven:4)  ) Recent Results (from the past 240 hour(s))  Blood Culture (routine x 2)     Status: None (Preliminary result)   Collection Time: 08/16/2019 10:20 PM   Specimen: BLOOD  Result Value Ref Range Status   Specimen Description BLOOD RIGHT ANTECUBITAL  Final   Special Requests   Final    BOTTLES DRAWN AEROBIC AND ANAEROBIC Blood Culture results  may not be optimal due to an inadequate volume of blood received in culture bottles   Culture   Final    NO GROWTH < 12 HOURS Performed at [facility], [address] [place], [place] [address]    Report Status PENDING  Incomplete  Blood Culture (routine x 2)     Status: None (Preliminary result)   Collection Time: 08/15/2019 10:50 PM   Specimen: BLOOD  Result Value Ref Range Status   Specimen Description BLOOD LEFT ANTECUBITAL  Final   Special Requests   Final    BOTTLES DRAWN AEROBIC AND ANAEROBIC Blood Culture results may not be optimal due to an inadequate volume of blood received in culture bottles   Culture   Final    NO GROWTH < 12 HOURS Performed at [facility], [address] [place], [place] [address]    Report Status PENDING  Incomplete  Urine culture     Status: Abnormal   Collection Time: 08/10/2019 11:23 PM   Specimen: In/Out Cath Urine  Result Value Ref Range Status   Specimen Description IN/OUT CATH URINE  Final   Special Requests   Final    NONE Performed at [facility], [address] [place], [place] [address]    Culture MULTIPLE SPECIES PRESENT, SUGGEST RECOLLECTION (A)  Final   Report Status 08/22/2019 FINAL  Final  SARS Coronavirus 2 by RT PCR (hospital order, performed in [facility] hospital lab) Nasopharyngeal Nasopharyngeal Swab     Status: None   Collection Time: 08/22/19 12:42 AM   Specimen: Nasopharyngeal Swab  Result Value Ref Range Status   SARS Coronavirus 2 NEGATIVE NEGATIVE Final    Comment: (NOTE) SARS-CoV-2 target nucleic acids are NOT DETECTED.  The SARS-CoV-2 RNA is generally detectable in upper and lower respiratory specimens during the acute phase of infection. The lowest concentration of SARS-CoV-2 viral copies this assay can detect is 250 copies / mL. A negative result does not preclude SARS-CoV-2 infection and should not be used as the sole basis for treatment or other patient management decisions.  A negative  result may occur with improper specimen collection / handling, submission of specimen other than nasopharyngeal swab, presence of viral mutation(s) within the areas targeted by this assay, and inadequate number of viral copies (<250 copies / mL). A negative result must be combined with clinical observations, patient history,  and epidemiological information.  Fact Sheet for Patients:   BoilerBrush.com.cy  Fact Sheet for Healthcare Providers: https://pope.com/  This test is not yet approved or  cleared by the [place] FDA and has been authorized for detection and/or diagnosis of SARS-CoV-2 by FDA under an Emergency Use Authorization (EUA).  This EUA will remain in effect (meaning this test can be used) for the duration of the COVID-19 declaration under Section 564(b)(1) of the Act, 21 U.S.C. section 360bbb-3(b)(1), unless the authorization is terminated or revoked sooner.  Performed at [facility], [address], [place], [place] [address]       Radiology Studies: CT ABDOMEN PELVIS WO CONTRAST  Result Date: 08/22/2019 CLINICAL DATA:  Sepsis EXAM: CT ABDOMEN AND PELVIS WITHOUT CONTRAST TECHNIQUE: Multidetector CT imaging of the abdomen and pelvis was performed following the standard protocol without IV contrast. COMPARISON:  11/27/2010 FINDINGS: LOWER CHEST: Small right pleural effusion with basilar atelectasis. Large hiatal hernia. There is atherosclerotic calcification of the mitral valve. HEPATOBILIARY: Normal hepatic contours. No intra- or extrahepatic biliary dilatation. There is cholelithiasis without acute inflammation. PANCREAS: Normal pancreas. No ductal dilatation or peripancreatic fluid collection. SPLEEN: Normal. ADRENALS/URINARY TRACT: The adrenal glands are normal. No hydronephrosis, nephroureterolithiasis or solid renal mass. The urinary bladder is normal for degree of distention STOMACH/BOWEL: There is a large  hiatal hernia that contains segment of small bowel. The stomach is mostly decompressed. No small bowel dilatation or inflammation. There is a large stool ball in the rectum. Normal appendix. VASCULAR/LYMPHATIC: There is calcific atherosclerosis of the abdominal aorta. No lymphadenopathy. REPRODUCTIVE: Normal uterus. No adnexal mass. MUSCULOSKELETAL. Lumbar levoscoliosis. No acute abnormality. Multilevel advanced facet arthrosis and vertebral osteophytosis. OTHER: None. IMPRESSION: 1. No acute abnormality of the abdomen or pelvis. 2. Large hiatal hernia. 3. Large stool ball in the rectum. 4. Small right pleural effusion with basilar atelectasis. 5. Aortic Atherosclerosis (ICD10-I70.0). Electronically Signed   By: Deatra Robinson M.D.   On: 08/22/2019 03:11   CT Head Wo Contrast  Result Date: August 23, 2019 CLINICAL DATA:  Recent mental status change EXAM: CT HEAD WITHOUT CONTRAST TECHNIQUE: Contiguous axial images were obtained from the base of the skull through the vertex without intravenous contrast. COMPARISON:  07/05/2011 FINDINGS: Brain: Chronic atrophic and white matter ischemic changes are again seen and stable. No findings to suggest acute hemorrhage, acute infarction or space-occupying mass lesion are seen. Vascular: No hyperdense vessel or unexpected calcification. Skull: Normal. Negative for fracture or focal lesion. Sinuses/Orbits: No acute finding. Other: None. IMPRESSION: Chronic atrophic and ischemic changes.  No acute abnormality noted. Electronically Signed   By: Alcide Clever M.D.   On: 23-Aug-2019 23:40   DG Chest Port 1 View  Result Date: 08/22/2019 CLINICAL DATA:  86 year old female with weakness. EXAM: PORTABLE CHEST 1 VIEW COMPARISON:  Chest radiograph dated 07/05/2014. FINDINGS: Probable trace bilateral pleural effusions and bibasilar atelectasis. No lobar consolidation or pneumothorax. Stable cardiomegaly. Atherosclerotic calcification of the aorta. The previously seen hiatal hernia is less  conspicuous on today's exam. Osteopenia. No acute osseous pathology. IMPRESSION: 1. Probable trace bilateral pleural effusions and bibasilar atelectasis. 2. Stable cardiomegaly. Electronically Signed   By: Elgie Collard M.D.   On: 23-Aug-2019 22:54     Scheduled Meds: Continuous Infusions: . morphine 1 mg/hr (07/30/2019 0933)     LOS: 1 day   Time Spent in minutes   45 minutes  Jarika Robben D.O. on 08/04/2019 at 12:15 PM  Between 7am to 7pm - Please see pager noted  on [email or web address]  After 7pm go to [email or web address]  And look for the night coverage person covering for me after hours  [facility] Office  [phone]

## 2019-08-28 DEATH — deceased

## 2021-09-11 IMAGING — CT CT HEAD W/O CM
4 series · 17 of 47 positions shown, 19 images · non-contrast
Comparison: 07/05/2011

CLINICAL DATA: Recent mental status change

EXAM:
CT HEAD WITHOUT CONTRAST
TECHNIQUE: Contiguous axial images were obtained from the base of the skull
through the vertex without intravenous contrast.

[Series 3: head wo · axial · 0.41mm/px · z∈[-134,+6]mm · 7 of 38 slices shown, 9 images]
[im 5/38  brain]
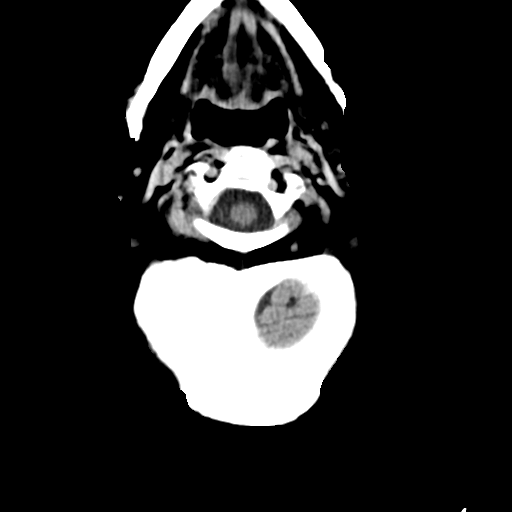
[im 5/38  bone]
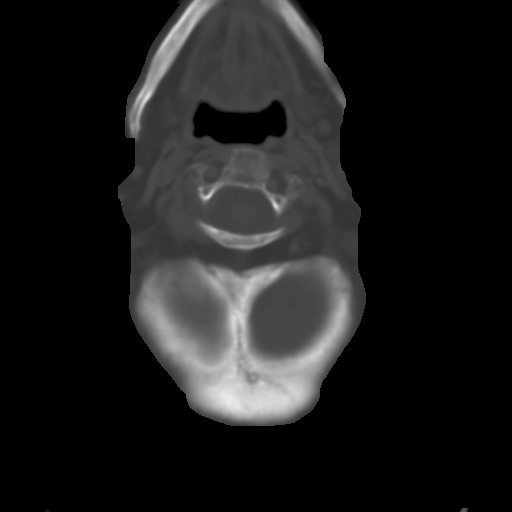
[im 10/38  brain]
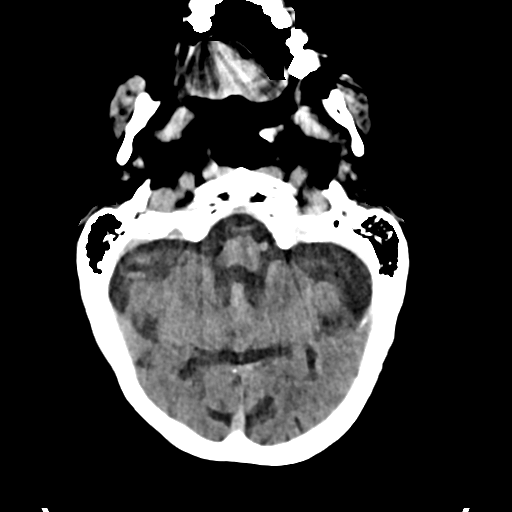
[im 14/38  brain]
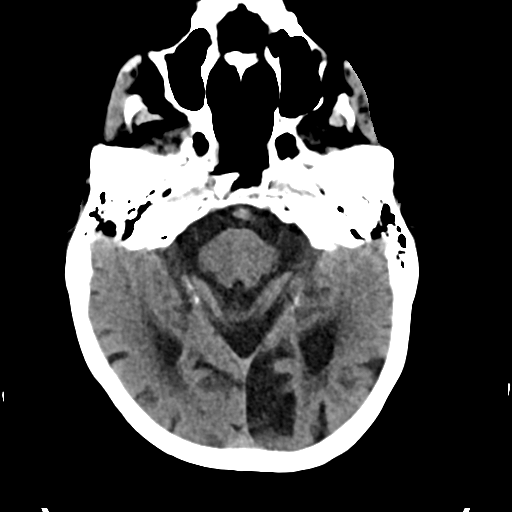
[im 19/38  brain]
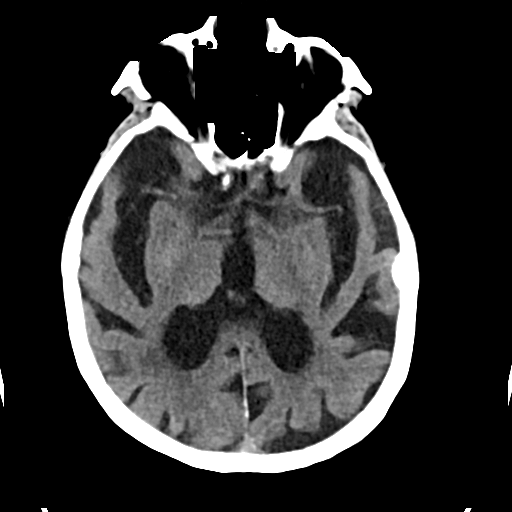
[im 24/38  brain]
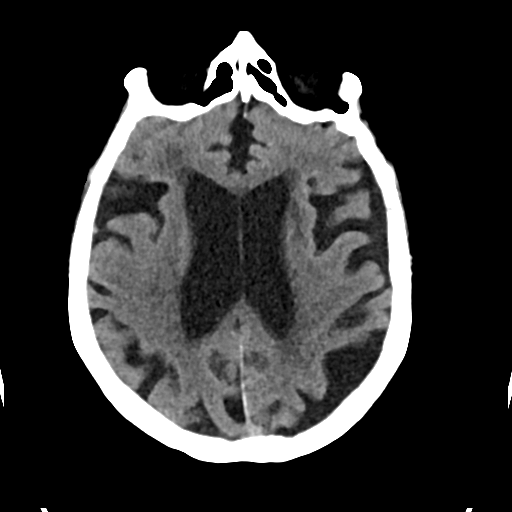
[im 24/38  bone]
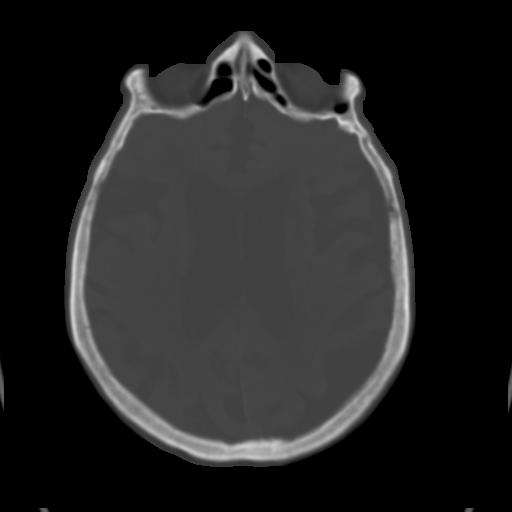
[im 28/38  brain]
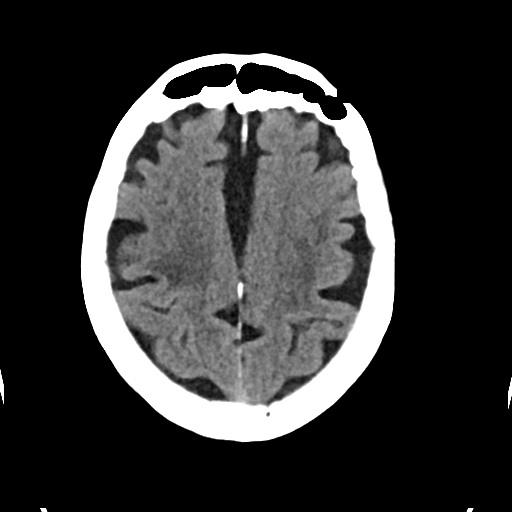
[im 33/38  brain]
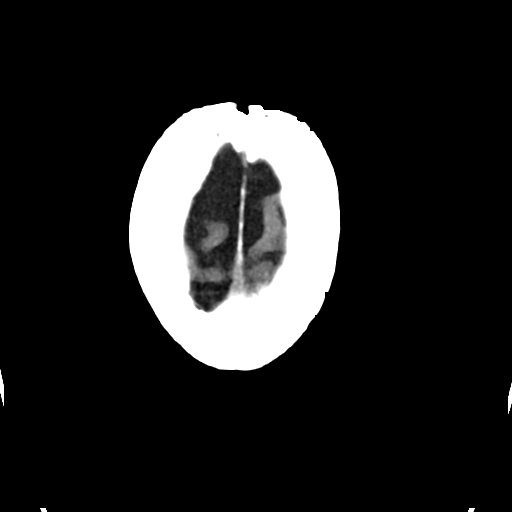

[Series 4: head bone · axial · 0.41mm/px · z∈[-136,-70]mm · 4 of 95 slices shown]
[im 10/95  bone]
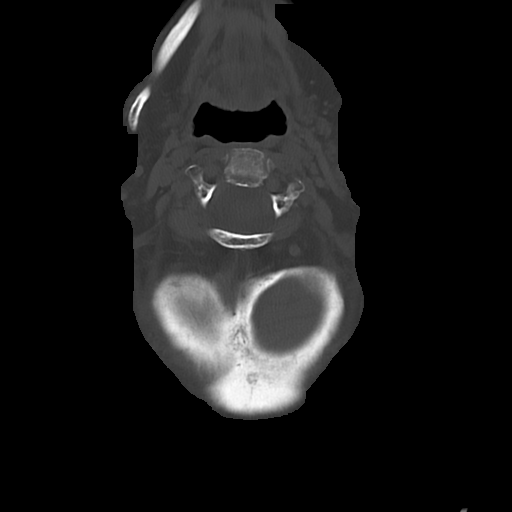
[im 19/95  bone]
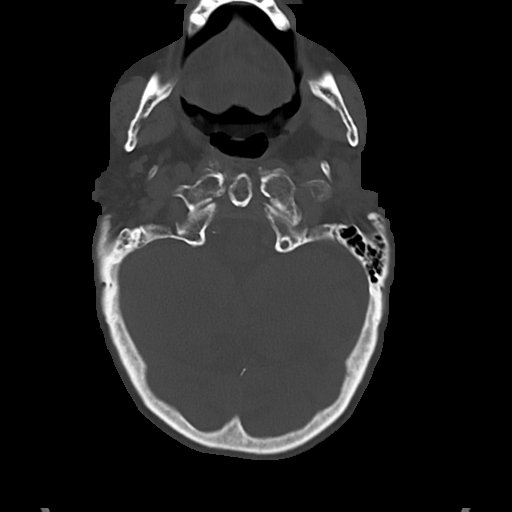
[im 29/95  bone]
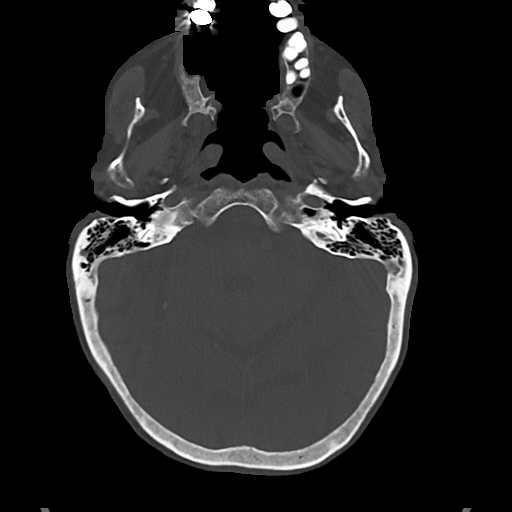
[im 43/95  bone]
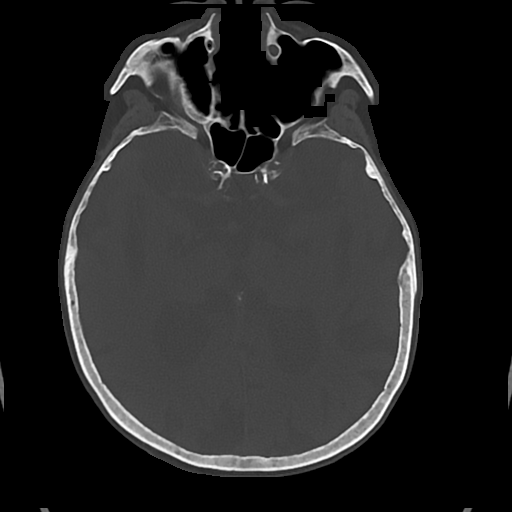

[Series 5: cor soft · coronal · 0.32mm/px · 3 of 71 slices shown]
[im 27/71  brain]
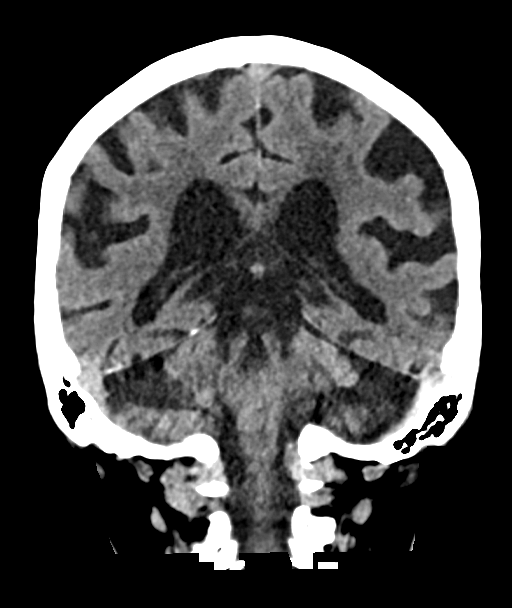
[im 33/71  brain]
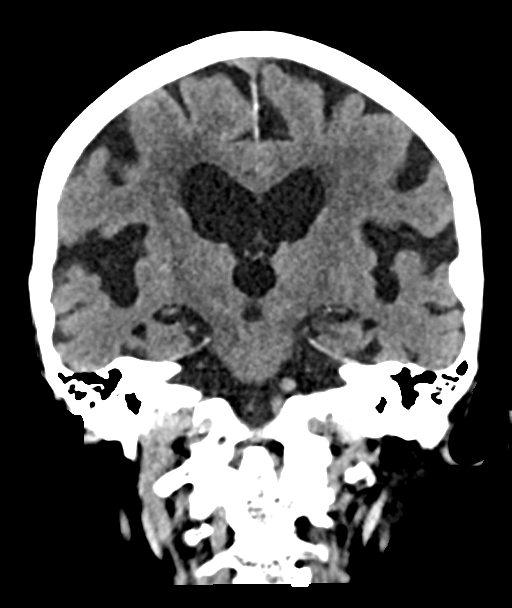
[im 39/71  brain]
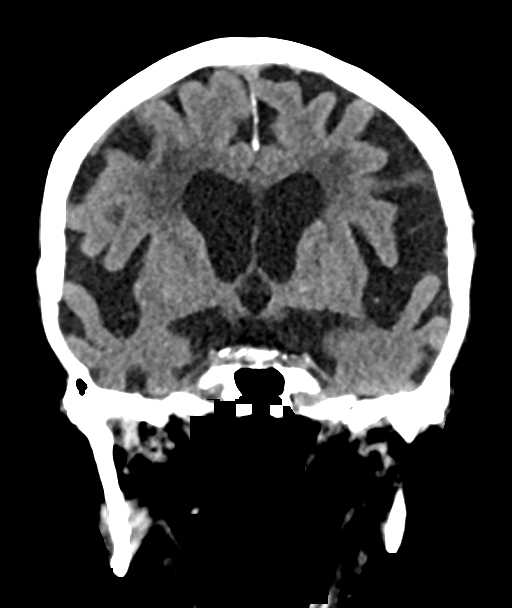

[Series 6: sag soft · sagittal · 0.39mm/px · 3 of 59 slices shown]
[im 20/59  brain]
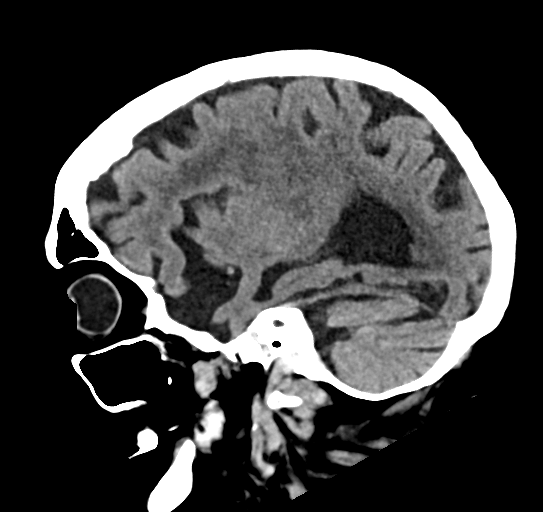
[im 30/59  brain]
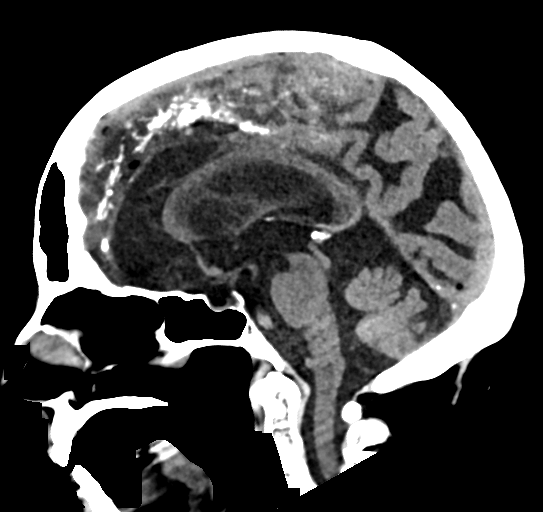
[im 39/59  brain]
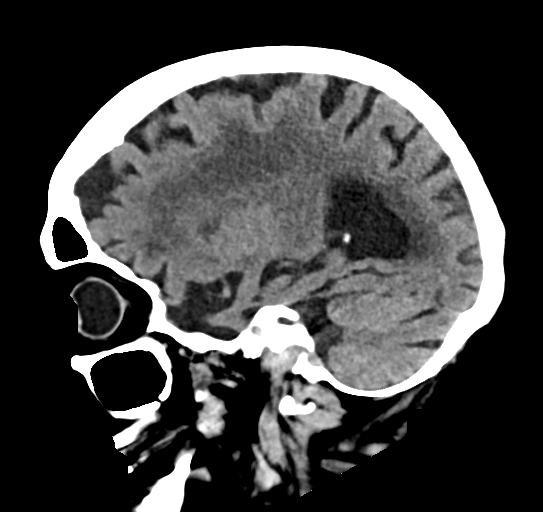

[17 of 47 positions shown; findings below may reference images not displayed]

FINDINGS: Brain: Chronic atrophic and white matter ischemic changes are again
seen and stable. No findings to suggest acute hemorrhage, acute
infarction or space-occupying mass lesion are seen.

Vascular: No hyperdense vessel or unexpected calcification.

Skull: Normal. Negative for fracture or focal lesion.

Sinuses/Orbits: No acute finding.

Other: None.
IMPRESSION: Chronic atrophic and ischemic changes.  No acute abnormality noted.
